# Patient Record
Sex: Male | Born: 1985 | Race: Black or African American | Hispanic: No
Health system: Western US, Academic
[De-identification: ages and names within clinical notes are randomized; demographics above are authoritative.]

## PROBLEM LIST (undated history)

## (undated) DIAGNOSIS — K219 Gastro-esophageal reflux disease without esophagitis: Secondary | ICD-10-CM

## (undated) DIAGNOSIS — Z21 Asymptomatic human immunodeficiency virus [HIV] infection status: Secondary | ICD-10-CM

## (undated) DIAGNOSIS — B20 Human immunodeficiency virus [HIV] disease: Secondary | ICD-10-CM

---

## 2013-04-22 ENCOUNTER — Ambulatory Visit (HOSPITAL_BASED_OUTPATIENT_CLINIC_OR_DEPARTMENT_OTHER): Payer: Medicaid Other

## 2013-04-22 ENCOUNTER — Other Ambulatory Visit (HOSPITAL_BASED_OUTPATIENT_CLINIC_OR_DEPARTMENT_OTHER): Payer: Self-pay | Admitting: Infectious Disease

## 2013-04-22 ENCOUNTER — Ambulatory Visit (HOSPITAL_BASED_OUTPATIENT_CLINIC_OR_DEPARTMENT_OTHER): Payer: Medicaid Other | Attending: Infectious Disease | Admitting: Infectious Disease

## 2013-04-22 ENCOUNTER — Encounter (HOSPITAL_BASED_OUTPATIENT_CLINIC_OR_DEPARTMENT_OTHER): Payer: Self-pay | Admitting: Infectious Disease

## 2013-04-22 VITALS — BP 112/68 | HR 71 | Temp 97.5°F | Resp 16 | Ht 73.31 in | Wt 153.2 lb

## 2013-04-22 DIAGNOSIS — B20 Human immunodeficiency virus [HIV] disease: Secondary | ICD-10-CM

## 2013-04-22 DIAGNOSIS — Z21 Asymptomatic human immunodeficiency virus [HIV] infection status: Secondary | ICD-10-CM | POA: Insufficient documentation

## 2013-04-22 DIAGNOSIS — K219 Gastro-esophageal reflux disease without esophagitis: Secondary | ICD-10-CM | POA: Insufficient documentation

## 2013-04-22 DIAGNOSIS — Z23 Encounter for immunization: Secondary | ICD-10-CM

## 2013-04-22 DIAGNOSIS — A539 Syphilis, unspecified: Secondary | ICD-10-CM | POA: Insufficient documentation

## 2013-04-22 LAB — URINALYSIS COMPLETE, URN
Bacteria, URN: NONE SEEN
Bilirubin (Qual), URN: NEGATIVE
Epith Cells_Renal/Trans,URN: NEGATIVE /HPF
Epith Cells_Squamous, URN: NEGATIVE /LPF
Glucose Qual, URN: NEGATIVE mg/dL
Ketones, URN: NEGATIVE mg/dL
Nitrite, URN: NEGATIVE
Occult Blood, URN: NEGATIVE
Protein (Alb Semiquant), URN: NEGATIVE mg/dL
RBC, URN: NEGATIVE /HPF
Specific Gravity, URN: 1.015 g/mL (ref 1.002–1.027)
pH, URN: 8 (ref 5.0–8.0)

## 2013-04-22 LAB — COMPREHENSIVE METABOLIC PANEL
ALT (GPT): 14 U/L (ref 10–64)
AST (GOT): 21 U/L (ref 15–40)
Albumin: 4.2 g/dL (ref 3.5–5.2)
Alkaline Phosphatase (Total): 70 U/L (ref 35–109)
Anion Gap: 5 (ref 3–11)
Bilirubin (Total): 0.4 mg/dL (ref 0.2–1.3)
Calcium: 9.4 mg/dL (ref 8.9–10.2)
Carbon Dioxide, Total: 24 mEq/L (ref 22–32)
Chloride: 105 mEq/L (ref 98–108)
Creatinine: 0.71 mg/dL (ref 0.51–1.18)
GFR, Calc, African American: >60 mL/min (ref >59)
GFR, Calc, European American: >60 mL/min (ref >59)
Glucose: 99 mg/dL (ref 62–125)
Potassium: 4 mEq/L (ref 3.7–5.2)
Protein (Total): 8 g/dL (ref 6.0–8.2)
Sodium: 134 mEq/L — ABNORMAL LOW (ref 136–145)
Urea Nitrogen: 8 mg/dL (ref 8–21)

## 2013-04-22 LAB — CBC, DIFF
% Basophils: 0 %
% Eosinophils: 7 %
% Immature Granulocytes: 0 %
% Lymphocytes: 49 %
% Monocytes: 9 %
% Neutrophils: 35 %
Absolute Eosinophil Count: 0.25 10*3/uL (ref 0.00–0.50)
Absolute Lymphocyte Count: 1.65 10*3/uL (ref 1.00–4.80)
Basophils: 0.01 10*3/uL (ref 0.00–0.20)
Hematocrit: 38 % (ref 38–50)
Hemoglobin: 13.1 g/dL (ref 13.0–18.0)
Immature Granulocytes: 0 10*3/uL (ref 0.00–0.05)
MCH: 28.1 pg (ref 27.3–33.6)
MCHC: 34.4 g/dL (ref 32.2–36.5)
MCV: 82 fL (ref 81–98)
Monocytes: 0.31 10*3/uL (ref 0.00–0.80)
Neutrophils: 1.19 10*3/uL — ABNORMAL LOW (ref 1.80–7.00)
Platelet Count: 208 10*3/uL (ref 150–400)
RBC: 4.66 mil/uL (ref 4.40–5.60)
RDW-CV: 14 % (ref 11.6–14.4)
WBC: 3.41 10*3/uL — ABNORMAL LOW (ref 4.30–10.00)

## 2013-04-22 MED ORDER — ABACAVIR-DOLUTEGRAVIR-LAMIVUD 600-50-300 MG OR TABS
1.0000 | ORAL_TABLET | Freq: Every day | ORAL | Status: DC
Start: 2013-04-22 — End: 2013-08-19

## 2013-04-22 NOTE — Progress Notes (Addendum)
[facility] - Outpatient Initial Clinic Note    DATE: 04/22/2013     CHIEF COMPLAINT:  Joshua Hammond is a 28 year old male who is here today for HIV     Assessment / Plan    # HIV, stage I:  I'm glad to have Joshua Hammond here in [place]. Records just became available for him, showing a most recent CD4 of 430, VL undetectable on Dolutegravir-Lamivudine-Abacavir. 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 I'm not anticipating a need for major changes. His adherence could be better. We discussed this, as well as a few strategies to do so.     # h/o Syphilis:   No symptoms at present. We will screen for now anyways.    # GERD:  Symptoms are quiescent for now. I wonder if this had to do with prior alcohol (it was certainly a trigger). Samuella Bruin is another consideration. If the symptoms recur, we can go hunting.     # HCM:   Hepatitis serologies from OSH: Hep A / B / C nonreactive.    GC/CT (U/P/A)   Syphilis screen  Influneza vaccine    -------------------------------------------  Attending: Ancil Boozer, MD  I discussed the history, exam and medical decision making for this patient with Dr.  Tia Alert. I was present in the clinic during the provisions of these services with no other clinical duties. I concur with the management plan as discussed.  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          He was diagnosed with HIV in July 2014, after being admitted for GI distress (in his mind, a GERD flare) with nausea and vomiting severe enough to merit hospitalization. HIV was sent as part of a general workup, and returned as positive. It had been some time since his last HIV testing. Per outside records, his initial CD4 count was 249, VL ~ 8000. After HLA-B57:01 returned as negative, he was started on ABC-lamivudine + dolutegravir. This was switched later to the triumeq combination pill (of the same medications). He endorses missing about one dose a week. On this regimen, his viral load dropped to undetectable. His last CD4 was 430.     He feels well at present: No GI or GERD symptoms. He notes no side effects from his medications. He feel healthy at present.     ROS:  Other than the HPI, all other systems are negative.      PROBLEM LIST:  Patient Active Problem List    Diagnosis Date Noted   . HIV (human immunodeficiency virus infection), Stage II [V08] 04/22/2013     HIV PCP: Maurice Small  Case Manager: TBD  HIV History:     Diagnosis date: Jul 2014 (VL 7880)    Risk factors for HIV: MSM    CD4 nadir: 249.  ARV history: Abacavir - Lamivudine-dolutegravir (Triumeq)    HIV related illnesses: None     . GERD (gastroesophageal reflux disease) [530.81] 04/22/2013   . h/o Syphilis [097.9] 04/22/2013       MEDICATIONS:  Current Outpatient Prescriptions   Medication Sig Dispense Refill   . Abacavir-Dolutegravir-Lamivud 600-50-300 MG Oral Tab Take 1 tablet by mouth daily.  30 tablet  3     No current facility-administered medications for this visit.       ALLERGIES:  Review of patient's allergies indicates:  No Known Allergies    SOCIAL / FAMILY HISTORY  Living situation: Staying with a friend. Recent migrant from [place]. Has lived previously in [place] and [place].   Sex: MSM. Serosorts + condoms.   Tob: None  EtOH: Occasional / "seldom". Does endorse heavier use triggering GERD in the past  IDU:  Never    Family with diabetes mellitus.     PHYSICAL EXAM:  BP 112/68  Pulse 71  Temp(Src) 97.5 F (36.4 C) (Temporal)  Resp 16  Ht 6' 1.31" (1.862 m)  Wt 153 lb 3.2 oz (69.491 kg)  BMI 20.04 kg/m2 Wt 153 lb 3.2 oz (69.491 kg) Ht 6' 1.31" (1.862 m) Body mass index is 20.04 kg/(m^2).  PHYSICAL EXAM:  General: Pleasant and friendly, robust appearing young [place] American man, healthy, alert, no distress  Skin: Skin color, texture, turgor normal. No rashes or concerning lesions  Head: Normocephalic.  Eyes: Lids/periorbital skin normal, Conjunctivae/corneas clear  Nose:normal  Oropharynx: Lips, mucosa, and tongue normal. Teeth and gums normal., posterior pharynx without erythema or drainage  Neck: supple.   Lungs: clear to auscultation  Heart: normal rate, regular rhythm and no murmurs, clicks, or gallops  Abd: soft, non-tender. BS normal. No masses or organomegaly. Liver size grossly normal to percussion.   Neuro:  Grossly normal to observation, gait normal. Hearing intact.     HIV MONITORING  TBD    LAB RESULTS:    Results for orders placed in visit on 04/22/13   COMPREHENSIVE METABOLIC PANEL       Result Value Range    Sodium 134 (*) 136 - 145 mEq/L    Potassium 4.0  3.7 - 5.2 mEq/L    Chloride 105  98 - 108 mEq/L    Carbon Dioxide, Total 24  22 - 32 mEq/L    Anion Gap 5  3 - 11    Glucose 99  62 - 125 mg/dL    Urea Nitrogen 8  8 - 21 mg/dL    Creatinine 0.71  0.51 - 1.18 mg/dL    Protein (Total) 8.0  6.0 - 8.2 g/dL    Albumin 4.2  3.5 - 5.2 g/dL    Bilirubin (Total) 0.4  0.2 - 1.3 mg/dL    Calcium 9.4  8.9 - 10.2 mg/dL    AST (GOT) 21  15 - 40 U/L    Alkaline Phosphatase (Total) 70  35 - 109 U/L    ALT (GPT) 14  10 - 64 U/L    Gfr, Calc, European American >60  >59 mL/min    GFR, Calc, African American >60  >59 mL/min    GFR, Information        Value: Calculated GFR in mL/min/1.73 m2 by MDRD equation.    Inaccurate with changing renal function.  See    [email or web address]

## 2013-04-22 NOTE — Progress Notes (Signed)
VACCINE SCREENING/ORDER WHEN CONTRAINDICATIONS PRESENT    Order date: 04/22/2013  [facility]  Clinic stock used: NO  Interpreter used?: None  Vaccine information sheet(s) discussed, patient/parent/guardian verbalized understanding? NO  Vaccines given: Flu  VIS given 04/22/2013 by Felecia Shellingristian J Bergeron MA.      SCREENING: INACTIVATED VACCINES  NO 1. Do you have a high fever, severe cold-like symptoms or serious infection?  NO 2. Do you have any food allergies such as eggs, chicken, chicken feathers, chicken dander, or gelatin?  NO 3. Do you have any allergies to neomycin, streptomycin, or polymyxin?   NO 4. Have you had any severe reaction to a vaccine in the past such as a seizure, a convulsion from a high fever, or a paralysis problem called Guillain-Barre Syndrome? If so, which vaccine(s):       If the patient/parent/or guardian answered "yes" to any of the above questions, consult with the provider to review the responses prior to administering vaccination. Parents with a contraindication to immunization will not receive the immunization without a specific physician order to vaccinate the patient and discussion of risk and benefits related to the contraindication.     Physician Order for Administration of Vaccine(s) When Contraindications Are Present  I have reviewed the existence in this patient's health history of possible contraindication(s) to administration of vaccine. The benefits to this patient outweigh the potential risks of immunization. Administer vaccine(s):  Felecia ShellingBergeron, Tristian J, 04/22/2013 3:05 PM MA.

## 2013-04-23 LAB — HEPATITIS A,B,& C PANEL
Hepatitis A Ab: REACTIVE
Hepatitis B Core Ab: NONREACTIVE
Hepatitis B Surf Antibody Intl Units: 369.46 IU
Hepatitis B Surface Ab: REACTIVE
Hepatitis B Surface Antigen w/Reflex: NONREACTIVE
Hepatitis C Antibody w/Rflx PCR: NONREACTIVE

## 2013-04-23 LAB — GC&CHLAM NUCLEIC ACID DETECTN
Chlam Trachomatis Nucleic Acid: NEGATIVE
Chlam Trachomatis Nucleic Acid: NEGATIVE
Chlam Trachomatis Nucleic Acid: NEGATIVE
N.Gonorrhoeae(GC) Nucleic Acid: NEGATIVE
N.Gonorrhoeae(GC) Nucleic Acid: NEGATIVE
N.Gonorrhoeae(GC) Nucleic Acid: NEGATIVE

## 2013-04-23 LAB — SEROLOGIC SYPHILIS PANEL, SRM: Syphilis Igg Ab Result: POSITIVE — AB

## 2013-04-23 LAB — T CELL SUBSETS CD4/CD8 ONLY COUNTS
% CD4: 40 % (ref 33–61)
% CD8: 31 % (ref 14–35)
Abs CD4 Lymphocyte Cnt: 0.563 10*3/uL — ABNORMAL LOW (ref 0.733–2.25)
Abs CD8 Lymphocyte Cnt: 0.429 10*3/uL (ref 0.25–1.24)
CD4/CD8 Ratio: 1.29

## 2013-04-23 LAB — HIV1 RNA QUANTITATION: HIV RNA Result: NOT DETECTED Copies/mL

## 2013-04-24 LAB — QUANTIFERON TB TEST
Quantiferon TB Ag Result: 0.097 IU gamma IF/mL
Quantiferon TB MIT Result: 5.459 IU gamma IF/mL
Quantiferon TB Nil Result: 0.124 IU gamma IF/mL

## 2013-04-24 LAB — ANTI TOXOPLASMA, IGG
Anti Toxoplasma, IgG Interp: NONREACTIVE
Anti Toxoplasma, IgG Result: <3.2 IU/mL (ref 0.0–7.4)

## 2013-04-24 LAB — HIGH RES TYPING, HLA B (SENDOUT): High Res Typ, HLA B Result (Sendout): NEGATIVE

## 2013-04-24 NOTE — Addendum Note (Signed)
Addended by: Orlena SheldonHARRINGTON, Shamon D on: 04/24/2013 01:11 PM     Modules accepted: Level of Service

## 2013-04-25 LAB — RPR QUANTITATIVE (SENDOUT): RPR Quantitative (Sendout): NEGATIVE

## 2013-04-29 LAB — SEROLOGIC SYPHILIS CONFIRM
CAPTIA T. pallidum EIA, IgG: REACTIVE — AB
RPR Screen: NEGATIVE
Syphilis Conf RPR Confirmation: NEGATIVE
TP_PA: REACTIVE — AB

## 2013-05-20 ENCOUNTER — Encounter (HOSPITAL_BASED_OUTPATIENT_CLINIC_OR_DEPARTMENT_OTHER): Payer: Self-pay | Admitting: Infectious Disease

## 2013-05-20 ENCOUNTER — Ambulatory Visit (HOSPITAL_BASED_OUTPATIENT_CLINIC_OR_DEPARTMENT_OTHER): Payer: No Typology Code available for payment source | Attending: Infectious Disease | Admitting: Infectious Disease

## 2013-05-20 VITALS — HR 85 | Temp 97.5°F | Resp 16 | Ht 73.0 in | Wt 160.0 lb

## 2013-05-20 DIAGNOSIS — Z23 Encounter for immunization: Secondary | ICD-10-CM | POA: Insufficient documentation

## 2013-05-20 NOTE — Progress Notes (Addendum)
[facility] - Outpatient Return Clinic Note    DATE: 05/20/2013     CHIEF COMPLAINT:  Joshua Hammond is a 28 year old male who is here today for HIV    Assessment / Plan  Today was my second visit with the delightful Joshua Hammond.    # HIV, stage I:  Doing reasonably well on his current regimen of ABC-Lamivudine-Dolutegravir. Missing about a dose a week. I'd still prefer to have slightly better adherence.    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 I'm not anticipating a need for major changes.    # h/o Syphilis:   Quant RPR negative one month ago. No new symptoms. Will consider screening again at the next visit.     # GERD:  Symptoms are quiescent for now.    # LEFT Leg pain:  Symptoms have resolved now. I wonder if this represented a VZV flare, quickly resolved. We agreed to be watchful, and have him evaluated if symptoms should recur.     # HCM:  - Hep A / B TBD  - PCV-13 [ x]  Mar / 2015    -------------------------------------------  Attending: Ancil Boozer, MD  I discussed the history, exam and medical decision making for this patient with Dr.  Tia Alert. I was present in the clinic during the provisions of these services with no other clinical duties. I concur with the management plan as discussed. 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 He's been taking his HIV meds without trouble, missing about one dose a week or less.    He does report one concern. About a week ago, he developed some burning pain on his left thigh, associated with some decreased sensation. He developed no rashes nor sores at the site. Symptoms  subsided in about four days. He had no associated symptomatic illness. He had chickenpox as a child.     ROS: Other than the HPI, all other systems are negative.    PROBLEM LIST:  Patient Active Problem List    Diagnosis Date Noted   . HIV (human immunodeficiency virus infection), Stage II [V08] 04/22/2013     HIV PCP: Maurice Small  Case Manager: TBD  HIV History:     Diagnosis date: Jul 2014 (VL 7880)    Risk factors for HIV: MSM    CD4 nadir: 249.     ARV history: Abacavir - Lamivudine-dolutegravir (Triumeq)    HIV related illnesses: None     . GERD (gastroesophageal reflux disease) [530.81] 04/22/2013   . h/o Syphilis [097.9] 04/22/2013       MEDICATIONS:  Current Outpatient Prescriptions   Medication Sig Dispense Refill   . Abacavir-Dolutegravir-Lamivud 600-50-300 MG Oral Tab Take 1 tablet by mouth daily.  30 tablet  3     No current facility-administered medications for this visit.       ALLERGIES:  Review of patient's allergies indicates:  No Known Allergies    SOCIAL / FAMILY HISTORY  Living situation: Staying with a friend. Recent migrant from [place]. Has lived previously in [place] and [place].   Sex: MSM. Serosorts + condoms.   Tob: None  EtOH: Occasional / "seldom". Heavier use triggering GERD  in the past  IDU: Never    Family with diabetes mellitus.     PHYSICAL EXAM:  Pulse 85  Temp(Src) 97.5 F (36.4 C) (Temporal)  Resp 16  Ht 6' 1"  (1.854 m)  Wt 160 lb (72.576 kg)  BMI 21.11 kg/m2  PHYSICAL EXAM:  General: Pleasant and friendly young African American man, healthy, alert, no distress  Skin: No rash, erythroderma, on bilateral thighs.   Head: Normocephalic  Eyes: Sclera anicteric  Lungs: Comfortable on room air without increased WOB  Neuro:  Grossly normal to observation, gait normal. Sensation to light touch intact and symmetric on bilateral thighs.     HIV LABS:  HIV RNA Result (Copies/mL)   Date Value   04/22/2013 None detected.       Abs CD4 Lymphocyte Cnt (THOU/uL)   Date Value   04/22/2013  0.563*         LAB RESULTS:      Results for orders placed in visit on 04/22/13   COMPREHENSIVE METABOLIC PANEL       Result Value Range    Sodium 134 (*) 136 - 145 mEq/L    Potassium 4.0  3.7 - 5.2 mEq/L    Chloride 105  98 - 108 mEq/L    Carbon Dioxide, Total 24  22 - 32 mEq/L    Anion Gap 5  3 - 11    Glucose 99  62 - 125 mg/dL    Urea Nitrogen 8  8 - 21 mg/dL    Creatinine 0.71  0.51 - 1.18 mg/dL    Protein (Total) 8.0  6.0 - 8.2 g/dL    Albumin 4.2  3.5 - 5.2 g/dL    Bilirubin (Total) 0.4  0.2 - 1.3 mg/dL    Calcium 9.4  8.9 - 10.2 mg/dL    AST (GOT) 21  15 - 40 U/L    Alkaline Phosphatase (Total) 70  35 - 109 U/L    ALT (GPT) 14  10 - 64 U/L    Gfr, Calc, European American >60  >59 mL/min    GFR, Calc, African American >60  >59 mL/min    GFR, Information        Value: Calculated GFR in mL/min/1.73 m2 by MDRD equation.    Inaccurate with changing renal function.  See   [email or web address]

## 2013-05-20 NOTE — Progress Notes (Signed)
VACCINE SCREENING/ORDER WHEN CONTRAINDICATIONS PRESENT    Order date: 05/20/2013  Ordering provider:Golob  Clinic stock used: YES   Interpreter used?: None  Vaccine information sheet(s) discussed, patient/parent/guardian verbalized understanding? YES   Vaccines given: Prevnar 13  VIS given 05/20/2013 by Felecia Shellingristian J Bergeron MA.      SCREENING: INACTIVATED VACCINES  NO 1. Do you have a high fever, severe cold-like symptoms or serious infection?  NO 2. Do you have any food allergies such as eggs, chicken, chicken feathers, chicken dander, or gelatin?  NO 3. Do you have any allergies to neomycin, streptomycin, or polymyxin?   NO 4. Have you had any severe reaction to a vaccine in the past such as a seizure, a convulsion from a high fever, or a paralysis problem called Guillain-Barre Syndrome? If so, which vaccine(s):     If the patient/parent/or guardian answered "yes" to any of the above questions, consult with the provider to review the responses prior to administering vaccination. Parents with a contraindication to immunization will not receive the immunization without a specific physician order to vaccinate the patient and discussion of risk and benefits related to the contraindication.     Physician Order for Administration of Vaccine(s) When Contraindications Are Present  I have reviewed the existence in this patient's health history of possible contraindication(s) to administration of vaccine. The benefits to this patient outweigh the potential risks of immunization. Administer vaccine(s):  Felecia ShellingBergeron, Tristian J, 05/20/2013 2:24 PM MA.

## 2013-05-29 ENCOUNTER — Other Ambulatory Visit (EMERGENCY_DEPARTMENT_HOSPITAL): Payer: Self-pay | Admitting: Emergency Medical Services

## 2013-05-29 ENCOUNTER — Ambulatory Visit: Payer: Self-pay

## 2013-05-29 ENCOUNTER — Emergency Department (HOSPITAL_BASED_OUTPATIENT_CLINIC_OR_DEPARTMENT_OTHER)
Admission: EM | Admit: 2013-05-29 | Discharge: 2013-05-29 | Disposition: A | Discharge status: Home / Self Care | Payer: No Typology Code available for payment source | Attending: Anesthesiology | Admitting: Anesthesiology

## 2013-05-29 DIAGNOSIS — I499 Cardiac arrhythmia, unspecified: Secondary | ICD-10-CM | POA: Insufficient documentation

## 2013-05-29 DIAGNOSIS — E860 Dehydration: Secondary | ICD-10-CM | POA: Insufficient documentation

## 2013-05-29 DIAGNOSIS — R110 Nausea: Secondary | ICD-10-CM

## 2013-05-29 DIAGNOSIS — R112 Nausea with vomiting, unspecified: Secondary | ICD-10-CM

## 2013-05-29 DIAGNOSIS — R109 Unspecified abdominal pain: Secondary | ICD-10-CM | POA: Insufficient documentation

## 2013-05-29 LAB — COMPREHENSIVE METABOLIC PANEL
ALT (GPT): 16 U/L (ref 10–64)
AST (GOT): 18 U/L (ref 9–38)
Albumin: 5 g/dL (ref 3.5–5.2)
Alkaline Phosphatase (Total): 70 U/L (ref 35–109)
Anion Gap: 7 (ref 4–12)
Bilirubin (Total): 0.5 mg/dL (ref 0.2–1.3)
Calcium: 10.3 mg/dL — ABNORMAL HIGH (ref 8.9–10.2)
Carbon Dioxide, Total: 26 mEq/L (ref 22–32)
Chloride: 102 mEq/L (ref 98–108)
Creatinine: 0.88 mg/dL (ref 0.51–1.18)
GFR, Calc, African American: >60 mL/min (ref >59)
GFR, Calc, European American: >60 mL/min (ref >59)
Glucose: 114 mg/dL (ref 62–125)
Potassium: 3.5 mEq/L — ABNORMAL LOW (ref 3.6–5.2)
Protein (Total): 9.4 g/dL — ABNORMAL HIGH (ref 6.0–8.2)
Sodium: 135 mEq/L (ref 135–145)
Urea Nitrogen: 11 mg/dL (ref 8–21)

## 2013-05-29 LAB — CBC, DIFF
% Basophils: 0 %
% Eosinophils: 1 %
% Immature Granulocytes: 0 %
% Lymphocytes: 24 %
% Monocytes: 5 %
% Neutrophils: 70 %
Absolute Eosinophil Count: 0.1 10*3/uL (ref 0.00–0.50)
Absolute Lymphocyte Count: 1.7 10*3/uL (ref 1.00–4.80)
Basophils: 0.03 10*3/uL (ref 0.00–0.20)
Hematocrit: 43 % (ref 38–50)
Hemoglobin: 14.9 g/dL (ref 13.0–18.0)
Immature Granulocytes: 0.01 10*3/uL (ref 0.00–0.05)
MCH: 28.3 pg (ref 27.3–33.6)
MCHC: 35.1 g/dL (ref 32.2–36.5)
MCV: 81 fL (ref 81–98)
Monocytes: 0.33 10*3/uL (ref 0.00–0.80)
Neutrophils: 4.79 10*3/uL (ref 1.80–7.00)
Platelet Count: 190 10*3/uL (ref 150–400)
RBC: 5.27 mil/uL (ref 4.40–5.60)
RDW-CV: 14.2 % (ref 11.6–14.4)
WBC: 6.96 10*3/uL (ref 4.30–10.00)

## 2013-05-29 LAB — PROTHROMBIN & PTT
Partial Thromboplastin Time: 30 s (ref 22–35)
Prothrombin INR: 1 (ref 0.8–1.3)
Prothrombin Time Patient: 12.9 s (ref 10.7–15.6)

## 2013-05-29 LAB — ALCOHOL (ETHYL): Alcohol (Ethyl): NEGATIVE mg/dL

## 2013-05-29 LAB — 1ST EXTRA ORANGE TOP

## 2013-05-29 LAB — LIPASE: Lipase: 31 U/L (ref <70)

## 2013-05-29 NOTE — Telephone Encounter (Signed)
Protocol: ABDOMINAL PAIN - MALE-ADULT-OH  Affirmative: SEVERE abdominal pain (e.g., excruciating)  Disposition of Go to ED Now suggested.

## 2013-05-29 NOTE — Telephone Encounter (Signed)
S/w pt briefly who was so incapacitated by pain that he asked partner to complete call. C/o severe constant abd pain w/vomiting x multiple episodes onset last night. Immunocompromised.

## 2013-06-01 ENCOUNTER — Other Ambulatory Visit (EMERGENCY_DEPARTMENT_HOSPITAL): Payer: Self-pay | Admitting: Family

## 2013-06-01 ENCOUNTER — Emergency Department (HOSPITAL_BASED_OUTPATIENT_CLINIC_OR_DEPARTMENT_OTHER)
Admission: EM | Admit: 2013-06-01 | Discharge: 2013-06-01 | Disposition: A | Discharge status: Home / Self Care | Payer: No Typology Code available for payment source | Attending: Emergency Medicine | Admitting: Emergency Medicine

## 2013-06-01 DIAGNOSIS — R112 Nausea with vomiting, unspecified: Secondary | ICD-10-CM

## 2013-06-01 DIAGNOSIS — R109 Unspecified abdominal pain: Secondary | ICD-10-CM

## 2013-06-01 DIAGNOSIS — Z79899 Other long term (current) drug therapy: Secondary | ICD-10-CM

## 2013-06-01 DIAGNOSIS — B20 Human immunodeficiency virus [HIV] disease: Secondary | ICD-10-CM

## 2013-06-01 LAB — CBC, DIFF
% Basophils: 0 %
% Eosinophils: 0 %
% Immature Granulocytes: 0 %
% Lymphocytes: 21 %
% Monocytes: 6 %
% Neutrophils: 73 %
Absolute Eosinophil Count: 0.01 10*3/uL (ref 0.00–0.50)
Absolute Lymphocyte Count: 1.06 10*3/uL (ref 1.00–4.80)
Basophils: 0.01 10*3/uL (ref 0.00–0.20)
Hematocrit: 40 % (ref 38–50)
Hemoglobin: 14 g/dL (ref 13.0–18.0)
Immature Granulocytes: 0.01 10*3/uL (ref 0.00–0.05)
MCH: 28.4 pg (ref 27.3–33.6)
MCHC: 35.2 g/dL (ref 32.2–36.5)
MCV: 81 fL (ref 81–98)
Monocytes: 0.29 10*3/uL (ref 0.00–0.80)
Neutrophils: 3.58 10*3/uL (ref 1.80–7.00)
Platelet Count: 200 10*3/uL (ref 150–400)
RBC: 4.93 mil/uL (ref 4.40–5.60)
RDW-CV: 14 % (ref 11.6–14.4)
WBC: 4.96 10*3/uL (ref 4.30–10.00)

## 2013-06-01 LAB — L LACTATE, VENOUS WB (TO U/H LAB WITHIN 30 MIN)
L Lactate (Direct), Venous Whole Blood: 3.2 mmol/L — ABNORMAL HIGH (ref 0.6–2.5)
L Lactate (Direct), Venous Whole Blood: 0.9 mmol/L (ref 0.6–2.5)

## 2013-06-01 LAB — COMPREHENSIVE METABOLIC PANEL
ALT (GPT): 14 U/L (ref 10–64)
AST (GOT): 17 U/L (ref 9–38)
Albumin: 4.6 g/dL (ref 3.5–5.2)
Alkaline Phosphatase (Total): 66 U/L (ref 35–109)
Anion Gap: 8 (ref 4–12)
Bilirubin (Total): 0.5 mg/dL (ref 0.2–1.3)
Calcium: 9.9 mg/dL (ref 8.9–10.2)
Carbon Dioxide, Total: 25 mEq/L (ref 22–32)
Chloride: 102 mEq/L (ref 98–108)
Creatinine: 0.77 mg/dL (ref 0.51–1.18)
GFR, Calc, African American: >60 mL/min (ref >59)
GFR, Calc, European American: >60 mL/min (ref >59)
Glucose: 125 mg/dL (ref 62–125)
Potassium: 3.7 mEq/L (ref 3.6–5.2)
Protein (Total): 8.4 g/dL — ABNORMAL HIGH (ref 6.0–8.2)
Sodium: 135 mEq/L (ref 135–145)
Urea Nitrogen: 6 mg/dL — ABNORMAL LOW (ref 8–21)

## 2013-06-01 LAB — URINALYSIS COMPLETE, URN
Bacteria, URN: NONE SEEN
Bilirubin (Qual), URN: NEGATIVE
Epith Cells_Renal/Trans,URN: NEGATIVE /HPF
Epith Cells_Squamous, URN: NEGATIVE /LPF
Glucose Qual, URN: NEGATIVE mg/dL
Ketones, URN: NEGATIVE mg/dL
Leukocyte Esterase, URN: NEGATIVE
Nitrite, URN: NEGATIVE
Occult Blood, URN: NEGATIVE
RBC, URN: NEGATIVE /HPF
Specific Gravity, URN: 1.016 g/mL (ref 1.002–1.027)
WBC, URN: NEGATIVE /HPF
pH, URN: 8.5 — ABNORMAL HIGH (ref 5.0–8.0)

## 2013-06-01 LAB — LIPASE: Lipase: 7 U/L (ref <70)

## 2013-06-01 LAB — 1ST EXTRA BLUE TOP

## 2013-06-30 ENCOUNTER — Other Ambulatory Visit (EMERGENCY_DEPARTMENT_HOSPITAL): Payer: Self-pay | Admitting: Emergency Medical Services

## 2013-06-30 ENCOUNTER — Emergency Department (HOSPITAL_BASED_OUTPATIENT_CLINIC_OR_DEPARTMENT_OTHER)
Admission: EM | Admit: 2013-06-30 | Discharge: 2013-06-30 | Disposition: A | Discharge status: Home / Self Care | Payer: No Typology Code available for payment source | Attending: Emergency Medicine | Admitting: Emergency Medicine

## 2013-06-30 DIAGNOSIS — R1032 Left lower quadrant pain: Secondary | ICD-10-CM | POA: Insufficient documentation

## 2013-06-30 DIAGNOSIS — K219 Gastro-esophageal reflux disease without esophagitis: Secondary | ICD-10-CM | POA: Insufficient documentation

## 2013-06-30 DIAGNOSIS — Z21 Asymptomatic human immunodeficiency virus [HIV] infection status: Secondary | ICD-10-CM

## 2013-06-30 DIAGNOSIS — R1110 Vomiting, unspecified: Secondary | ICD-10-CM | POA: Insufficient documentation

## 2013-06-30 LAB — CBC (HEMOGRAM)
Hematocrit: 41 % (ref 38–50)
Hemoglobin: 14.6 g/dL (ref 13.0–18.0)
MCH: 28.9 pg (ref 27.3–33.6)
MCHC: 35.3 g/dL (ref 32.2–36.5)
MCV: 82 fL (ref 81–98)
Platelet Count: 196 10*3/uL (ref 150–400)
RBC: 5.06 mil/uL (ref 4.40–5.60)
RDW-CV: 14.2 % (ref 11.6–14.4)
WBC: 6.51 10*3/uL (ref 4.30–10.00)

## 2013-06-30 LAB — COMPREHENSIVE METABOLIC PANEL
ALT (GPT): 12 U/L (ref 10–64)
AST (GOT): 16 U/L (ref 9–38)
Albumin: 4.7 g/dL (ref 3.5–5.2)
Alkaline Phosphatase (Total): 60 U/L (ref 35–109)
Anion Gap: 4 (ref 4–12)
Bilirubin (Total): 0.4 mg/dL (ref 0.2–1.3)
Calcium: 9.8 mg/dL (ref 8.9–10.2)
Carbon Dioxide, Total: 29 mEq/L (ref 22–32)
Chloride: 104 mEq/L (ref 98–108)
Creatinine: 0.86 mg/dL (ref 0.51–1.18)
GFR, Calc, African American: >60 mL/min (ref >59)
GFR, Calc, European American: >60 mL/min (ref >59)
Glucose: 118 mg/dL (ref 62–125)
Potassium: 3.7 mEq/L (ref 3.6–5.2)
Protein (Total): 8.6 g/dL — ABNORMAL HIGH (ref 6.0–8.2)
Sodium: 137 mEq/L (ref 135–145)
Urea Nitrogen: 11 mg/dL (ref 8–21)

## 2013-06-30 LAB — 1ST EXTRA BLUE TOP

## 2013-06-30 LAB — 1ST EXTRA ORANGE TOP

## 2013-07-01 ENCOUNTER — Other Ambulatory Visit (EMERGENCY_DEPARTMENT_HOSPITAL): Payer: Self-pay | Admitting: Family

## 2013-07-01 ENCOUNTER — Emergency Department (HOSPITAL_BASED_OUTPATIENT_CLINIC_OR_DEPARTMENT_OTHER)
Admission: EM | Admit: 2013-07-01 | Discharge: 2013-07-01 | Disposition: A | Discharge status: Home / Self Care | Payer: No Typology Code available for payment source | Attending: Emergency Medicine | Admitting: Emergency Medicine

## 2013-07-01 DIAGNOSIS — R109 Unspecified abdominal pain: Secondary | ICD-10-CM

## 2013-07-01 DIAGNOSIS — R112 Nausea with vomiting, unspecified: Secondary | ICD-10-CM

## 2013-07-01 DIAGNOSIS — R1110 Vomiting, unspecified: Secondary | ICD-10-CM

## 2013-07-01 LAB — 1ST EXTRA ORANGE TOP

## 2013-07-01 LAB — COMPREHENSIVE METABOLIC PANEL
ALT (GPT): 12 U/L (ref 10–64)
AST (GOT): 17 U/L (ref 9–38)
Albumin: 4.6 g/dL (ref 3.5–5.2)
Alkaline Phosphatase (Total): 61 U/L (ref 35–109)
Anion Gap: 8 (ref 4–12)
Bilirubin (Total): 0.4 mg/dL (ref 0.2–1.3)
Calcium: 9.8 mg/dL (ref 8.9–10.2)
Carbon Dioxide, Total: 25 mEq/L (ref 22–32)
Chloride: 103 mEq/L (ref 98–108)
Creatinine: 0.72 mg/dL (ref 0.51–1.18)
GFR, Calc, African American: >60 mL/min (ref >59)
GFR, Calc, European American: >60 mL/min (ref >59)
Glucose: 111 mg/dL (ref 62–125)
Potassium: 3.5 mEq/L — ABNORMAL LOW (ref 3.6–5.2)
Protein (Total): 8.4 g/dL — ABNORMAL HIGH (ref 6.0–8.2)
Sodium: 136 mEq/L (ref 135–145)
Urea Nitrogen: 6 mg/dL — ABNORMAL LOW (ref 8–21)

## 2013-07-01 LAB — URINALYSIS COMPLETE, URN
Bilirubin (Qual), URN: NEGATIVE
Epith Cells_Renal/Trans,URN: NEGATIVE /HPF
Epith Cells_Squamous, URN: NEGATIVE /LPF
Glucose Qual, URN: NEGATIVE mg/dL
Leukocyte Esterase, URN: NEGATIVE
Nitrite, URN: NEGATIVE
Occult Blood, URN: NEGATIVE
RBC, URN: NEGATIVE /HPF
Specific Gravity, URN: 1.028 g/mL — ABNORMAL HIGH (ref 1.002–1.027)
WBC, URN: NEGATIVE /HPF
pH, URN: 7.5 (ref 5.0–8.0)

## 2013-07-01 LAB — CBC (HEMOGRAM)
Hematocrit: 39 % (ref 38–50)
Hemoglobin: 13.8 g/dL (ref 13.0–18.0)
MCH: 28.2 pg (ref 27.3–33.6)
MCHC: 35.4 g/dL (ref 32.2–36.5)
MCV: 80 fL — ABNORMAL LOW (ref 81–98)
Platelet Count: 202 10*3/uL (ref 150–400)
RBC: 4.89 mil/uL (ref 4.40–5.60)
RDW-CV: 13.9 % (ref 11.6–14.4)
WBC: 6.47 10*3/uL (ref 4.30–10.00)

## 2013-07-01 LAB — LIPASE: Lipase: 12 U/L (ref <70)

## 2013-07-01 LAB — 1ST EXTRA BLUE TOP

## 2013-07-30 ENCOUNTER — Emergency Department (HOSPITAL_BASED_OUTPATIENT_CLINIC_OR_DEPARTMENT_OTHER)
Admission: EM | Admit: 2013-07-30 | Discharge: 2013-07-30 | Disposition: A | Discharge status: Home / Self Care | Payer: No Typology Code available for payment source | Attending: Physician Assistant | Admitting: Physician Assistant

## 2013-07-30 DIAGNOSIS — K047 Periapical abscess without sinus: Secondary | ICD-10-CM

## 2013-08-15 ENCOUNTER — Telehealth (HOSPITAL_BASED_OUTPATIENT_CLINIC_OR_DEPARTMENT_OTHER): Payer: Self-pay | Admitting: Infectious Disease

## 2013-08-15 NOTE — Telephone Encounter (Signed)
CONFIRMED PHONE NUMBER:   Telephone Information:   Home Phone [phone]   Work Phone [phone]   Mobile [phone]   CALLERS FIRST AND LAST NAME: Joshua Hammond  FACILITY NAME: n/a TITLE: n/a  CALLERS RELATIONSHIP:Self  RETURN CALL: General message on voicemail only     SUBJECT: Prescription Management   REASON FOR REQUEST: Refill request    MEDICATION: Abacavir  CONCERN/QUESTION: Refill request  PRESCRIBING PROVIDER: Dr. Darci NeedleJonathan Golob  DOSE TAKING NOW: n/a  PHARMACY NAME, LOCATION, & PHONE #: n/a     Patient called to request a refill on his medication.  He states that the pharmacy was closed and would like to get his refill.  He can be reached at [phone], it is okay to leave a detailed message.  Thank you.

## 2013-08-19 ENCOUNTER — Encounter (HOSPITAL_BASED_OUTPATIENT_CLINIC_OR_DEPARTMENT_OTHER): Payer: Self-pay | Admitting: Infectious Disease

## 2013-08-19 ENCOUNTER — Telehealth (HOSPITAL_BASED_OUTPATIENT_CLINIC_OR_DEPARTMENT_OTHER): Payer: Self-pay | Admitting: Pharmacy

## 2013-08-19 ENCOUNTER — Ambulatory Visit (HOSPITAL_BASED_OUTPATIENT_CLINIC_OR_DEPARTMENT_OTHER): Payer: No Typology Code available for payment source | Attending: Infectious Disease | Admitting: Infectious Disease

## 2013-08-19 VITALS — BP 115/68 | HR 80 | Temp 97.5°F | Resp 16 | Ht 73.0 in | Wt 147.0 lb

## 2013-08-19 DIAGNOSIS — F419 Anxiety disorder, unspecified: Secondary | ICD-10-CM

## 2013-08-19 DIAGNOSIS — B20 Human immunodeficiency virus [HIV] disease: Secondary | ICD-10-CM

## 2013-08-19 DIAGNOSIS — Z21 Asymptomatic human immunodeficiency virus [HIV] infection status: Secondary | ICD-10-CM | POA: Insufficient documentation

## 2013-08-19 DIAGNOSIS — F411 Generalized anxiety disorder: Secondary | ICD-10-CM | POA: Insufficient documentation

## 2013-08-19 MED ORDER — MIRTAZAPINE 15 MG OR TABS
15.0000 mg | ORAL_TABLET | Freq: Every evening | ORAL | Status: AC
Start: 2013-08-19 — End: ?

## 2013-08-19 MED ORDER — ABACAVIR-DOLUTEGRAVIR-LAMIVUD 600-50-300 MG OR TABS
1.0000 | ORAL_TABLET | Freq: Every day | ORAL | Status: DC
Start: 2013-08-19 — End: 2014-02-25

## 2013-08-19 NOTE — Progress Notes (Signed)
[facility] - Outpatient Return Clinic Note    DATE: 08/19/2013     CHIEF COMPLAINT:  Joshua Hammond is a 28 year old male who is here today for HIV    Assessment / Plan  # HIV, stage I:  Doing reasonably well on his current regimen of ABC-Lamivudine-Dolutegravir. Better adherence.     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 I'm not anticipating a need for major changes.    # Anxiety / Depression:  We discussed that most of his recent symptoms (lack of interest in activities, poor sleep, poor appetite, tearfulness, even some of the recent abdominal pain) all sound c/w significant depression symptoms. We discussed some choices, and settled on a trial of mirtazapine 70m PO qHS (OK to increase to 344mPO qHS if needed). I hope it will immediately help with his sleep and appetite, and in the coming weeks help further with mood.     We also discussed some non-pharmacologic interventions, primarily strategies to broaden his social circle.       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 Aside from a functional component, I wonder if there is a contribution from his escalated MJ use. Some aspects are reminiscent of gastroparesis. I suggested he cut back on MJ use to see if his symptoms improve.     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 No new partners.     # HCM:  - Hep A / B TBD  - PCV-13 [ x] Mar / 2015    ________________________________________________________________________  INTERVAL HISTORY/HPI:  [name] struggled a bit in the last few months, primarily with a down mood, low energy, poor sleep, poor appetite, and some unintended weight loss. He's been less interested in activities he previously enjoyed, and really doesn't feel himself. Anxiety, and social isolation have both contributed to him feeling off. The symptoms have become worse over the past few months. To help with both anxiety and sleep, he has  escalating his MJ use (to about twice daily); this initially helped, but recently less-so.     He's also struggled with some episodic abdominal pain, describing as feeling like a sudden onset of pressure or fullness that last for a few minutes. There is no association with activity, time of day, meals, types of food. He does note it tends to come on when he is feeling anxious, and relieves when he focuses on calming exercises.     ROS: Other than the HPI, all other systems are negative.    PROBLEM LIST:  Patient Active Problem List    Diagnosis Date Noted   . HIV (human immunodeficiency virus infection), Stage II [V08] 04/22/2013     HIV PCP: JoMaurice SmallCase Manager: TBD  HIV History:     Diagnosis date: Jul 2014 (VL 7880)    Risk factors for HIV: MSM    CD4 nadir: 249.     ARV history: Abacavir - Lamivudine-dolutegravir (Triumeq)    HIV related illnesses: None     . GERD (gastroesophageal reflux disease) [530.81] 04/22/2013   . h/o Syphilis [097.9] 04/22/2013       MEDICATIONS:  Current Outpatient Prescriptions   Medication Sig Dispense Refill   . Abacavir-Dolutegravir-Lamivud 600-50-300 MG Oral Tab Take 1 tablet by mouth daily. 30 tablet 3   . Mirtazapine 15 MG Oral Tab Take 1 tablet (15 mg) by mouth at bedtime. 60 tablet  2     No current facility-administered medications for this visit.       ALLERGIES:  Review of patient's allergies indicates:  No Known Allergies    SOCIAL / FAMILY HISTORY  Living situation: Staying with a friend. Recent migrant from [place]. Has lived previously in [place] and [place].   Sex: MSM. Serosorts + condoms.   Tob: None  EtOH: Occasional / "seldom". Heavier use triggering GERD in the past  IDU: Never    Family with diabetes mellitus.     PHYSICAL EXAM:  BP 115/68  Pulse 80  Temp(Src) 97.5 F (36.4 C) (Temporal)  Resp 16  Ht _0  (1.854 m)  Wt 147 lb (66.679 kg)  BMI 19.40 kg/m2  SpO2 98%  PHYSICAL EXAM:  General: Pleasant, friendly, [name], healthy, alert, no  distress  Skin: Skin color, texture, turgor normal.   Head: Normocephalic.  Eyes: Lids/periorbital skin normal, Conjunctivae/corneas clear  Oropharynx: Lips, mucosa, and tongue normal.  Neck: supple.   Lungs: Comfortable on room air without increased WOB.  Abd: soft, non-tender. BS normal. No masses or organomegaly. Liver approximately normal in span to percussion and non-tender edge.   Neuro:  Grossly normal to observation, gait normal    HIV LABS:  HIV RNA RESULT (Copies/mL)   Date Value   04/22/2013 None detected.      ABS CD4 LYMPHOCYTE CNT (THOU/uL)   Date Value   04/22/2013 0.563*         LAB RESULTS:    Results for orders placed in visit on 07/01/13   COMPREHENSIVE METABOLIC PANEL       Result Value Ref Range    Sodium 136  135 - 145 mEq/L    Potassium 3.5 (*) 3.6 - 5.2 mEq/L    Chloride 103  98 - 108 mEq/L    Carbon Dioxide, Total 25  22 - 32 mEq/L    Anion Gap 8  4 - 12    Glucose 111  62 - 125 mg/dL    Urea Nitrogen 6 (*) 8 - 21 mg/dL    Creatinine 0.72  0.51 - 1.18 mg/dL    Protein (Total) 8.4 (*) 6.0 - 8.2 g/dL    Albumin 4.6  3.5 - 5.2 g/dL    Bilirubin (Total) 0.4  0.2 - 1.3 mg/dL    Calcium 9.8  8.9 - 10.2 mg/dL    AST (GOT) 17  9 - 38 U/L    Alkaline Phosphatase (Total) 61  35 - 109 U/L    ALT (GPT) 12  10 - 64 U/L    Gfr, Calc, European American >60  >59 mL/min    GFR, Calc, African American >60  >59 mL/min    GFR, Information        Value: Calculated GFR in mL/min/1.73 m2 by MDRD equation.    Inaccurate with changing renal function.  See   [email or web address]

## 2013-08-19 NOTE — Telephone Encounter (Signed)
Prior Authorization Approval    Prescription Insurance: [facility]    Patient has received insurance approval for coverage of the following medication:    Medication: TRIUMEQ     Approved dates: 08/19/13-08/19/14    Approval #:       [facility]     PHARMACY NOTIFIED?    [  ] Fax:     [email or web address]  ] Telephone: approval done by phone with Isaias Cowman     [  ] Other:     Additional Comments:

## 2013-08-20 ENCOUNTER — Other Ambulatory Visit (EMERGENCY_DEPARTMENT_HOSPITAL): Payer: Self-pay | Admitting: Orthopaedic Surgery

## 2013-08-20 ENCOUNTER — Emergency Department (HOSPITAL_BASED_OUTPATIENT_CLINIC_OR_DEPARTMENT_OTHER)
Admission: EM | Admit: 2013-08-20 | Discharge: 2013-08-21 | Disposition: A | Discharge status: Home / Self Care | Payer: No Typology Code available for payment source | Attending: Emergency Medicine | Admitting: Emergency Medicine

## 2013-08-20 DIAGNOSIS — R109 Unspecified abdominal pain: Secondary | ICD-10-CM

## 2013-08-20 DIAGNOSIS — G8929 Other chronic pain: Secondary | ICD-10-CM | POA: Insufficient documentation

## 2013-08-20 LAB — URINALYSIS COMPLETE, URN
Bacteria, URN: NONE SEEN
Bilirubin (Qual), URN: NEGATIVE
Epith Cells_Renal/Trans,URN: NEGATIVE /HPF
Epith Cells_Squamous, URN: NEGATIVE /LPF
Glucose Qual, URN: NEGATIVE mg/dL
Leukocyte Esterase, URN: NEGATIVE
Nitrite, URN: NEGATIVE
Occult Blood, URN: NEGATIVE
Protein (Alb Semiquant), URN: NEGATIVE mg/dL
RBC, URN: NEGATIVE /HPF
Specific Gravity, URN: 1.016 g/mL (ref 1.002–1.027)
WBC, URN: NEGATIVE /HPF
pH, URN: 7 (ref 5.0–8.0)

## 2013-08-20 LAB — HEPATIC FUNCTION PANEL
ALT (GPT): 20 U/L (ref 10–64)
AST (GOT): 19 U/L (ref 9–38)
Albumin: 4.6 g/dL (ref 3.5–5.2)
Alkaline Phosphatase (Total): 74 U/L (ref 35–109)
Bilirubin (Direct): 0.1 mg/dL (ref 0.0–0.3)
Bilirubin (Total): 0.5 mg/dL (ref 0.2–1.3)
Protein (Total): 8.4 g/dL — ABNORMAL HIGH (ref 6.0–8.2)

## 2013-08-20 LAB — BASIC METABOLIC PANEL
Anion Gap: 7 (ref 4–12)
Calcium: 9.7 mg/dL (ref 8.9–10.2)
Carbon Dioxide, Total: 25 mEq/L (ref 22–32)
Chloride: 105 mEq/L (ref 98–108)
Creatinine: 0.73 mg/dL (ref 0.51–1.18)
GFR, Calc, African American: >60 mL/min (ref >59)
GFR, Calc, European American: >60 mL/min (ref >59)
Glucose: 116 mg/dL (ref 62–125)
Potassium: 3.6 mEq/L (ref 3.6–5.2)
Sodium: 137 mEq/L (ref 135–145)
Urea Nitrogen: 6 mg/dL — ABNORMAL LOW (ref 8–21)

## 2013-08-20 LAB — 1ST EXTRA BLUE TOP

## 2013-08-20 LAB — 1ST EXTRA ORANGE TOP

## 2013-08-20 LAB — CBC (HEMOGRAM)
Hematocrit: 41 % (ref 38–50)
Hemoglobin: 13.9 g/dL (ref 13.0–18.0)
MCH: 28.1 pg (ref 27.3–33.6)
MCHC: 34.2 g/dL (ref 32.2–36.5)
MCV: 82 fL (ref 81–98)
Platelet Count: 185 10*3/uL (ref 150–400)
RBC: 4.94 mil/uL (ref 4.40–5.60)
RDW-CV: 13.9 % (ref 11.6–14.4)
WBC: 5.74 10*3/uL (ref 4.30–10.00)

## 2013-08-20 LAB — LIPASE: Lipase: 8 U/L (ref <70)

## 2013-08-20 NOTE — Progress Notes (Signed)
-------------------------------------------    Attending: Octavia Heir.  I discussed the history, exam and medical decision making for this patient with Dr.  Eustaquio Maize. I was present in the clinic during the provisions of these services with no other clinical duties. I concur with the management plan as discussed. 1) HIV - suppressed on triumeq - no change in this today, 2) Depression with poor appetite and some anxiety - will trial mirtazapine and suggested he reduce use of MJ (in cae its responsible for GI symptoms)  -------------------------------------------

## 2013-08-22 ENCOUNTER — Emergency Department (HOSPITAL_BASED_OUTPATIENT_CLINIC_OR_DEPARTMENT_OTHER)
Admission: EM | Admit: 2013-08-22 | Discharge: 2013-08-22 | Disposition: A | Discharge status: Home / Self Care | Payer: No Typology Code available for payment source | Attending: Physician Assistant | Admitting: Physician Assistant

## 2013-08-22 ENCOUNTER — Other Ambulatory Visit (EMERGENCY_DEPARTMENT_HOSPITAL): Payer: Self-pay | Admitting: Physician Assistant

## 2013-08-22 DIAGNOSIS — R112 Nausea with vomiting, unspecified: Secondary | ICD-10-CM | POA: Insufficient documentation

## 2013-08-22 DIAGNOSIS — R109 Unspecified abdominal pain: Secondary | ICD-10-CM | POA: Insufficient documentation

## 2013-08-22 DIAGNOSIS — Z21 Asymptomatic human immunodeficiency virus [HIV] infection status: Secondary | ICD-10-CM

## 2013-08-22 LAB — COMPREHENSIVE METABOLIC PANEL
ALT (GPT): 17 U/L (ref 10–64)
AST (GOT): 14 U/L (ref 9–38)
Albumin: 4.6 g/dL (ref 3.5–5.2)
Alkaline Phosphatase (Total): 66 U/L (ref 35–109)
Anion Gap: 5 (ref 4–12)
Bilirubin (Total): 0.5 mg/dL (ref 0.2–1.3)
Calcium: 10 mg/dL (ref 8.9–10.2)
Carbon Dioxide, Total: 29 mEq/L (ref 22–32)
Chloride: 101 mEq/L (ref 98–108)
Creatinine: 0.76 mg/dL (ref 0.51–1.18)
GFR, Calc, African American: >60 mL/min (ref >59)
GFR, Calc, European American: >60 mL/min (ref >59)
Glucose: 132 mg/dL — ABNORMAL HIGH (ref 62–125)
Potassium: 3.7 mEq/L (ref 3.6–5.2)
Protein (Total): 8.4 g/dL — ABNORMAL HIGH (ref 6.0–8.2)
Sodium: 135 mEq/L (ref 135–145)
Urea Nitrogen: 8 mg/dL (ref 8–21)

## 2013-08-22 LAB — CBC, DIFF
% Basophils: 0 %
% Eosinophils: 0 %
% Immature Granulocytes: 0 %
% Lymphocytes: 14 %
% Monocytes: 3 %
% Neutrophils: 83 %
Absolute Eosinophil Count: 0 10*3/uL (ref 0.00–0.50)
Absolute Lymphocyte Count: 0.79 10*3/uL — ABNORMAL LOW (ref 1.00–4.80)
Basophils: 0 10*3/uL (ref 0.00–0.20)
Hematocrit: 43 % (ref 38–50)
Hemoglobin: 15 g/dL (ref 13.0–18.0)
Immature Granulocytes: 0.01 10*3/uL (ref 0.00–0.05)
MCH: 28.4 pg (ref 27.3–33.6)
MCHC: 35.3 g/dL (ref 32.2–36.5)
MCV: 80 fL — ABNORMAL LOW (ref 81–98)
Monocytes: 0.17 10*3/uL (ref 0.00–0.80)
Neutrophils: 4.75 10*3/uL (ref 1.80–7.00)
Platelet Count: 228 10*3/uL (ref 150–400)
RBC: 5.29 mil/uL (ref 4.40–5.60)
RDW-CV: 13.8 % (ref 11.6–14.4)
WBC: 5.72 10*3/uL (ref 4.30–10.00)

## 2013-08-22 LAB — 1ST EXTRA ORANGE TOP

## 2013-08-22 LAB — PROTHROMBIN & PTT
Partial Thromboplastin Time: 31 s (ref 22–35)
Prothrombin INR: 1 (ref 0.8–1.3)
Prothrombin Time Patient: 13 s (ref 10.7–15.6)

## 2013-09-06 ENCOUNTER — Telehealth (HOSPITAL_BASED_OUTPATIENT_CLINIC_OR_DEPARTMENT_OTHER): Payer: Self-pay | Admitting: Infectious Disease

## 2013-09-06 ENCOUNTER — Ambulatory Visit: Payer: Self-pay

## 2013-09-06 NOTE — Telephone Encounter (Signed)
Spoke to TRUE---he has been vomiting when he eats solid food but is able to keep down fluids and pills.  He says he was diagnosed with a stomach infection a long time ago and he never finished taking the treatment for it.  He thinks that's what's causing his symptoms now.    No appts available on 6/22 so I spoke to Dr. Eustaquio MaizeGolob who is willing to [name] on 6/22.  I called [name] back and scheduled him to see Dr. Eustaquio MaizeGolob on 6/22 at 12:15.  In the meantime I asked him to include gatorade or sports drinks along with water to stay hydrated.  Try eating bland foods in small amts--avoid dairy and greasy or fried foods until he's not vomited for 24 hrs.  If his vomiting worsens or his symptoms worsen at all before Monday I recommended he go to the nearest ED for eval

## 2013-09-06 NOTE — Telephone Encounter (Signed)
CONFIRMED PHONE NUMBER: [phone]   CALLERS FIRST AND LAST NAME: Nadeen Landauobert Leshawn Chandran  FACILITY NAME: na TITLE: na  CALLERS RELATIONSHIP:Self  RETURN CALL: Detailed message on voicemail only     SUBJECT: General Message   REASON FOR REQUEST: Patient states he has been having vomiting/throwing up to the point where he is not able to take his nightly medications.  Patient states he has severe stomach pain/sudden onset and has been having night sweats for about a week and a half as of 6/19.  Patient states he is dizzy, and has numbness/tingling in his knee when he stood up earlier on 6/19.  Patient spoke with consulting RN Judeth CornfieldStephanie from the community careline, who didn't think it was an emergency department or 911 situation, but that he did need to be evaluated in the next 24 hours and that the numbness/tingling may be from dehydration.  [facility].  Please advise.  Thank you.  The caller requested having a call to discuss from his care team at [facility], since [facility] attempted to connect and was unable to get a clinic staff member on the line.  Please contact patient as soon as possible in regards to this matter.  Thank you.  The caller stated that a detailed voicemail is okay if they are unable to pick up for any reason.  Please advise.  Thank you.  CCR sending message as high priority as patient states there is high medical necessity.  Please advise.  Thank you.      MESSAGE: See above.

## 2013-09-06 NOTE — Telephone Encounter (Signed)
Protocol: DIZZINESS - LIGHTHEADEDNESS-ADULT-AH  Affirmative: Taking a medicine that could cause dizziness (e.g., blood pressure medications, diuretics)  Disposition of See Physician Within 24 Hours suggested.

## 2013-09-06 NOTE — Telephone Encounter (Signed)
Urgent triage from contact center, Joshua Hammond, re dizziness    Past few weeks severe stomach pain, diarrhea, believes its from med  Weak, faint, dizzy  Probably dehydrated  EMS/911 not indicated.  Had contact center send message to clinic for further pt assistance, clinic currently open.    Call CCL back anytime if symptoms worsen or further questions.

## 2013-09-09 ENCOUNTER — Encounter (HOSPITAL_BASED_OUTPATIENT_CLINIC_OR_DEPARTMENT_OTHER): Payer: Self-pay | Admitting: Infectious Disease

## 2013-09-09 ENCOUNTER — Ambulatory Visit (HOSPITAL_BASED_OUTPATIENT_CLINIC_OR_DEPARTMENT_OTHER): Payer: No Typology Code available for payment source | Attending: Infectious Disease | Admitting: Infectious Disease

## 2013-09-09 VITALS — BP 104/60 | HR 71 | Temp 97.5°F | Resp 16 | Wt 142.0 lb

## 2013-09-09 DIAGNOSIS — R197 Diarrhea, unspecified: Secondary | ICD-10-CM | POA: Insufficient documentation

## 2013-09-09 DIAGNOSIS — R1110 Vomiting, unspecified: Secondary | ICD-10-CM | POA: Insufficient documentation

## 2013-09-09 MED ORDER — METOCLOPRAMIDE HCL 5 MG OR TABS
5.0000 mg | ORAL_TABLET | Freq: Four times a day (QID) | ORAL | Status: AC | PRN
Start: 2013-09-09 — End: ?

## 2013-09-09 MED ORDER — ONDANSETRON HCL 4 MG OR TABS
4.0000 mg | ORAL_TABLET | Freq: Two times a day (BID) | ORAL | Status: AC | PRN
Start: 2013-09-09 — End: ?

## 2013-09-09 MED ORDER — OMEPRAZOLE 20 MG OR CPDR
20.0000 mg | DELAYED_RELEASE_CAPSULE | Freq: Every day | ORAL | Status: AC
Start: 2013-09-09 — End: ?

## 2013-09-09 NOTE — Progress Notes (Signed)
[facility] - Outpatient Return Clinic Note    DATE: 09/09/2013     CHIEF COMPLAINT:  Joshua Hammond is a 28 year old male who is here today for HIV  and nausea / vomiting.    Assessment / Plan  # HIV, stage I:  Doing reasonably well on his current regimen of ABC-Lamivudine-Dolutegravir. Nausea is a challenge to his adherence.    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 I'm not anticipating a need for major changes.    # Nausea / Vomiting / Loose stools:  H. Pylori remains the most likely underlying etiology given his current symptoms, historic symptoms, and history of only partial treatment. We agreed to H.pylori stool antigen testing. We'll test for C.diff, largely for the ongoing low-level diarrhea.     After he's given a sample, we'll start omeprazole for symptomatic relief. Since prospective ondansetron was helpful to him (and is relatively benign), we'll start that as well as an option. For a PRN, I offered metaclopramide as a PRN to be used if the omeprazole and onsdansetron prove to be insufficient, after carefully discussing and describing tardive dyskinesia as a possible (rare) side effect.     Given his preserved CD4 count, and good adherence, I do not think a workup for rarer / AIDS associated infections would be fruitful.     We touched on MJ and cyclic vomiting syndrome. I do not think this is the most likely diagnosis at this time--particularly after a two week period of abstinence did not improve symptoms.     If he's H. Pylori positive, we'll start treatment. If all of his workup is negative, and symptoms are ongoing, we can revisit the MJ question, and ask for him to be evaluated by GI.    # Anxiety / Depression:  We'll put the mirtazapine on hold for now, while we work up his nausea / vomiting.     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 No new partners.     # HCM:  - Hep A / B TBD  - PCV-13 [ x] Mar / 2015      ________________________________________________________________________  INTERVAL HISTORY/HPI:  For the past three weeks (or so), Joshua Hammond has struggled with another stretch of recurrent nausea, vomiting, and diarrhea. He describes waking up in the morning (or more rarely at other times of day) with the sudden onset of severe nausea, followed by repeated emesis. When vomiting, he has central, cramping, abdominal pain, but otherwise is pain free. There is no blood in the emesis; he describes mostly foodstuffs, followed by watery / foamy / stomach-acid like material. Concurrently, he's been having chronic loose stools. A typical bout lasts about 24hrs.     Ondansetron taken AFTER the onset of symptoms has not been helpful; neither was promethazine provided by the ED. Ondansetron taken as a scheduled medication in the mornings helped somewhat (he felt) staving off an episode. He found IVF and IV antiemetics (via the ED) to be the most helpful in resolving an episode once started. Staying calm at home, and slowly introducing liquid foods perhaps worked as well. He has not trialed antacids. He abstained from [place] for about two weeks (from a baseline of daily use), and did not notice a change in the pattern of emesis.     He has no cough, no fevers, no chills. He does get the sweats after a long stretch of emesis. He notes no clear association between his medications  and the onset or worsening of symptoms    This is his second major episode of these symptoms. The first started before he acquired HIV. He recalls undergoing an EGD and colonoscopy, with biopsies eventually revealing (what sounds like) H. Pylori. He partially completed a multi-drug treatment with H. Pylori (stopping early when his symptoms resolved).       ROS: Other than the HPI, all other systems are negative.    PROBLEM LIST:  Patient Active Problem List    Diagnosis Date Noted   . HIV (human immunodeficiency virus infection), Stage II [V08] 04/22/2013      HIV PCP: Maurice Small  Case Manager: TBD  HIV History:     Diagnosis date: Jul 2014 (VL 7880)    Risk factors for HIV: MSM    CD4 nadir: 249.     ARV history: Abacavir - Lamivudine-dolutegravir (Triumeq)    HIV related illnesses: None     . GERD (gastroesophageal reflux disease) [530.81] 04/22/2013   . h/o Syphilis [097.9] 04/22/2013       MEDICATIONS:  Current Outpatient Prescriptions   Medication Sig Dispense Refill   . Abacavir-Dolutegravir-Lamivud 600-50-300 MG Oral Tab Take 1 tablet by mouth daily. 30 tablet 3   . Metoclopramide HCl 5 MG Oral Tab Take 1 tablet (5 mg) by mouth 4 times a day as needed for nausea/vomiting. 20 tablet 2   . Mirtazapine 15 MG Oral Tab Take 1 tablet (15 mg) by mouth at bedtime. 60 tablet 2   . Omeprazole 20 MG Oral CAPSULE DELAYED RELEASE Take 1 capsule (20 mg) by mouth daily on an empty stomach. Take at a different time of day than your HIV medications 30 capsule 1   . Ondansetron HCl 4 MG Oral Tab Take 1 tablet (4 mg) by mouth every 12 hours as needed for nausea/vomiting. 60 tablet 1     No current facility-administered medications for this visit.       ALLERGIES:  Review of patient's allergies indicates:  No Known Allergies    SOCIAL / FAMILY HISTORY  Living situation: Staying with a committed partner. Recent migrant from [place]. Has lived previously in [place] and [place].   Sex: MSM. Serosorts + condoms.   Tob: None  EtOH: Occasional / "seldom". Heavier use triggering GERD in the past. None recently.   IDU: Never  MJ: Daily    Family with diabetes mellitus.     PHYSICAL EXAM:  BP 104/60  Pulse 71  Temp(Src) 97.5 F (36.4 C) (Temporal)  Resp 16  Wt 142 lb (64.411 kg)  PHYSICAL EXAM:  General: Pleasant, friendly, thin and tall African American man, healthy, alert, no distress  Skin: Skin color, texture, turgor normal.  Head: Normocephalic.  Eyes: Sclera anicteric  Lungs: clear to auscultation  Heart: normal rate, regular rhythm and no murmurs, clicks, or gallops  Abd:  soft, non-tender. BS hypoactive. Liver edge non-tender. Liver approximately normal in size to percussion.   Neuro:  Grossly normal to observation, gait normal    HIV LABS:  HIV RNA RESULT (Copies/mL)   Date Value   04/22/2013 None detected.      ABS CD4 LYMPHOCYTE CNT (THOU/uL)   Date Value   04/22/2013 0.563*         LAB RESULTS:    Results for orders placed in visit on 08/20/13   CBC (HEMOGRAM)       Result Value Ref Range    WBC 5.74  4.30 - 10.00 THOU/uL  RBC 4.94  4.40 - 5.60 mil/uL    Hemoglobin 13.9  13.0 - 18.0 g/dL    Hematocrit 41  38 - 50 %    MCV 82  81 - 98 fL    MCH 28.1  27.3 - 33.6 pg    MCHC 34.2  32.2 - 36.5 g/dL    Platelet Count 185  150 - 400 THOU/uL    RDW-CV 13.9  11.6 - 14.4 %     Results for orders placed in visit on 08/22/13   COMPREHENSIVE METABOLIC PANEL       Result Value Ref Range    Sodium 135  135 - 145 mEq/L    Potassium 3.7  3.6 - 5.2 mEq/L    Chloride 101  98 - 108 mEq/L    Carbon Dioxide, Total 29  22 - 32 mEq/L    Anion Gap 5  4 - 12    Glucose 132 (*) 62 - 125 mg/dL    Urea Nitrogen 8  8 - 21 mg/dL    Creatinine 0.76  0.51 - 1.18 mg/dL    Protein (Total) 8.4 (*) 6.0 - 8.2 g/dL    Albumin 4.6  3.5 - 5.2 g/dL    Bilirubin (Total) 0.5  0.2 - 1.3 mg/dL    Calcium 10.0  8.9 - 10.2 mg/dL    AST (GOT) 14  9 - 38 U/L    Alkaline Phosphatase (Total) 66  35 - 109 U/L    ALT (GPT) 17  10 - 64 U/L    Gfr, Calc, European American >60  >59 mL/min    GFR, Calc, African American >60  >59 mL/min    GFR, Information        Value: Calculated GFR in mL/min/1.73 m2 by MDRD equation.  Inaccurate with changing renal function.  See [email or web address]     Results for orders placed in visit on 08/20/13   HEPATIC FUNCTION PANEL       Result Value Ref Range    Albumin 4.6  3.5 - 5.2 g/dL    Protein (Total) 8.4 (*) 6.0 - 8.2 g/dL    Bilirubin (Total) 0.5  0.2 - 1.3 mg/dL    Bilirubin (Direct) 0.1  0.0 - 0.3 mg/dL    Alkaline Phosphatase (Total) 74  35 - 109 U/L    AST  (GOT) 19  9 - 38 U/L    ALT (GPT) 20  10 - 64 U/L

## 2013-09-10 NOTE — Progress Notes (Signed)
-------------------------------------------    Attending: Octavia Heirobert D Cobe Viney Jr.  I discussed the history, exam and medical decision making for this patient with Dr.  Eustaquio MaizeGolob. I was present in the clinic during the provisions of these services with no other clinical duties. I concur with the management plan as discussed. 1) HIV - on triumeq and suppressed with CD4 > 600, 2) Persistent N&V - no effect of stopping all MJ, will Rx with PPI and check for H Pylori and will treat if found - if no response to PPI or H Pylori Rx will need EGD (abd CT times 2 have been negative along wlith eval for pancreatitis and hepatitis).  Also chekcing for C diff  -------------------------------------------

## 2013-09-11 ENCOUNTER — Other Ambulatory Visit (HOSPITAL_BASED_OUTPATIENT_CLINIC_OR_DEPARTMENT_OTHER): Payer: Self-pay | Admitting: Infectious Disease

## 2013-09-11 DIAGNOSIS — R197 Diarrhea, unspecified: Secondary | ICD-10-CM

## 2013-09-11 DIAGNOSIS — R1110 Vomiting, unspecified: Secondary | ICD-10-CM

## 2013-09-11 LAB — C. DIFFICILE TOXIN GENE BY PCR: C. difficile Toxin Gene by PCR: NEGATIVE

## 2013-09-14 LAB — H. PYLORI AG, STL: H. Pylori Ag, Stool (Sendout): NEGATIVE

## 2013-09-16 ENCOUNTER — Encounter (HOSPITAL_BASED_OUTPATIENT_CLINIC_OR_DEPARTMENT_OTHER): Payer: No Typology Code available for payment source | Admitting: Infectious Disease

## 2013-09-23 ENCOUNTER — Telehealth (HOSPITAL_BASED_OUTPATIENT_CLINIC_OR_DEPARTMENT_OTHER): Payer: Self-pay | Admitting: Infectious Disease

## 2013-09-23 NOTE — Telephone Encounter (Signed)
Attempted to call Mr. Loleta ChanceHill, and share the negative testing results for C.diff and H. Pylori. Call went directly to voicemail

## 2013-10-04 ENCOUNTER — Emergency Department (HOSPITAL_BASED_OUTPATIENT_CLINIC_OR_DEPARTMENT_OTHER): Admission: EM | Admit: 2013-10-04 | Discharge: 2013-10-05 | Payer: No Typology Code available for payment source

## 2013-10-14 ENCOUNTER — Encounter (HOSPITAL_BASED_OUTPATIENT_CLINIC_OR_DEPARTMENT_OTHER): Payer: No Typology Code available for payment source | Admitting: Infectious Disease

## 2014-02-25 ENCOUNTER — Other Ambulatory Visit (HOSPITAL_BASED_OUTPATIENT_CLINIC_OR_DEPARTMENT_OTHER): Payer: Self-pay | Admitting: Infectious Disease

## 2014-02-25 DIAGNOSIS — B20 Human immunodeficiency virus [HIV] disease: Secondary | ICD-10-CM

## 2014-02-26 NOTE — Telephone Encounter (Signed)
Triumeq was last filled on 12/22/13.    Patient last seen on 09/09/13 for HIV follow-up and was to return in 4 weeks.      Will defer refill authorization to provider given late refill and patient is due for follow-up and labs.    [facility]ed to [facility] to schedule follow up visit.

## 2014-02-27 NOTE — Telephone Encounter (Signed)
Could Not reach patient by phone (disconnected) sent unable to reach letter to patient

## 2014-03-06 MED ORDER — TRIUMEQ 600-50-300 MG OR TABS
ORAL_TABLET | ORAL | Status: AC
Start: 2014-03-06 — End: ?

## 2014-03-06 NOTE — Telephone Encounter (Signed)
Routing to on-call SW, as patient does not have established SW. Patient needs to be notified of refill status. [facility] sent unable to reach letter.

## 2014-03-06 NOTE — Telephone Encounter (Signed)
Patient MUST schedule follow-up before further refills.

## 2017-12-20 ENCOUNTER — Emergency Department (HOSPITAL_COMMUNITY): Payer: PRIVATE HEALTH INSURANCE

## 2017-12-20 ENCOUNTER — Emergency Department (HOSPITAL_COMMUNITY)
Admission: EM | Admit: 2017-12-20 | Discharge: 2017-12-20 | Disposition: A | Discharge status: Home / Self Care | Payer: PRIVATE HEALTH INSURANCE | Attending: Emergency Medicine | Admitting: Emergency Medicine

## 2017-12-20 ENCOUNTER — Encounter (HOSPITAL_COMMUNITY): Payer: Self-pay | Admitting: Family Medicine

## 2017-12-20 DIAGNOSIS — Z87891 Personal history of nicotine dependence: Secondary | ICD-10-CM | POA: Diagnosis not present

## 2017-12-20 DIAGNOSIS — R1011 Right upper quadrant pain: Secondary | ICD-10-CM | POA: Insufficient documentation

## 2017-12-20 DIAGNOSIS — F419 Anxiety disorder, unspecified: Secondary | ICD-10-CM | POA: Diagnosis not present

## 2017-12-20 DIAGNOSIS — Z79899 Other long term (current) drug therapy: Secondary | ICD-10-CM | POA: Diagnosis not present

## 2017-12-20 DIAGNOSIS — B20 Human immunodeficiency virus [HIV] disease: Secondary | ICD-10-CM | POA: Insufficient documentation

## 2017-12-20 DIAGNOSIS — R112 Nausea with vomiting, unspecified: Secondary | ICD-10-CM | POA: Diagnosis not present

## 2017-12-20 DIAGNOSIS — R1084 Generalized abdominal pain: Secondary | ICD-10-CM

## 2017-12-20 HISTORY — DX: Human immunodeficiency virus (HIV) disease: B20

## 2017-12-20 HISTORY — DX: Asymptomatic human immunodeficiency virus (hiv) infection status: Z21

## 2017-12-20 HISTORY — DX: Gastro-esophageal reflux disease without esophagitis: K21.9

## 2017-12-20 LAB — URINALYSIS, ROUTINE W REFLEX MICROSCOPIC
BILIRUBIN URINE: NEGATIVE
Bacteria, UA: NONE SEEN
GLUCOSE, UA: NEGATIVE mg/dL
HGB URINE DIPSTICK: NEGATIVE
Ketones, ur: 80 mg/dL — AB
LEUKOCYTES UA: NEGATIVE
NITRITE: NEGATIVE
PROTEIN: 100 mg/dL — AB
SPECIFIC GRAVITY, URINE: 1.038 — AB (ref 1.005–1.030)
pH: 5 (ref 5.0–8.0)

## 2017-12-20 LAB — COMPREHENSIVE METABOLIC PANEL
ALK PHOS: 68 U/L (ref 38–126)
ALT: 19 U/L (ref 0–44)
ANION GAP: 14 (ref 5–15)
AST: 21 U/L (ref 15–41)
Albumin: 4.8 g/dL (ref 3.5–5.0)
BILIRUBIN TOTAL: 1.3 mg/dL — AB (ref 0.3–1.2)
BUN: 16 mg/dL (ref 6–20)
CALCIUM: 10.1 mg/dL (ref 8.9–10.3)
CO2: 24 mmol/L (ref 22–32)
Chloride: 103 mmol/L (ref 98–111)
Creatinine, Ser: 0.91 mg/dL (ref 0.61–1.24)
GFR calc Af Amer: >60 mL/min (ref >60)
GFR calc non Af Amer: >60 mL/min (ref >60)
GLUCOSE: 119 mg/dL — AB (ref 70–99)
POTASSIUM: 3.6 mmol/L (ref 3.5–5.1)
Sodium: 141 mmol/L (ref 135–145)
Total Protein: 9.4 g/dL — ABNORMAL HIGH (ref 6.5–8.1)

## 2017-12-20 LAB — CBC
HEMATOCRIT: 45.8 % (ref 39.0–52.0)
HEMOGLOBIN: 16 g/dL (ref 13.0–17.0)
MCH: 28.6 pg (ref 26.0–34.0)
MCHC: 34.9 g/dL (ref 30.0–36.0)
MCV: 81.9 fL (ref 78.0–100.0)
Platelets: 214 10*3/uL (ref 150–400)
RBC: 5.59 MIL/uL (ref 4.22–5.81)
RDW: 13.4 % (ref 11.5–15.5)
WBC: 6 10*3/uL (ref 4.0–10.5)

## 2017-12-20 LAB — LIPASE, BLOOD: Lipase: 25 U/L (ref 11–51)

## 2017-12-20 MED ORDER — SODIUM CHLORIDE 0.9 % IV BOLUS
2000.0000 mL | Freq: Once | INTRAVENOUS | Status: AC
  Administered 2017-12-20: 2000 mL via INTRAVENOUS

## 2017-12-20 MED ORDER — SODIUM CHLORIDE 0.9 % IJ SOLN
INTRAMUSCULAR | Status: AC
  Filled 2017-12-20: qty 50

## 2017-12-20 MED ORDER — OMEPRAZOLE 20 MG PO CPDR: 20.0000 mg | DELAYED_RELEASE_CAPSULE | Freq: Every day | ORAL | 0 refills | Status: AC

## 2017-12-20 MED ORDER — ONDANSETRON 4 MG PO TBDP: 4.0000 mg | ORAL_TABLET | Freq: Three times a day (TID) | ORAL | 0 refills | Status: AC | PRN

## 2017-12-20 MED ORDER — IOHEXOL 300 MG/ML  SOLN
100.0000 mL | Freq: Once | INTRAMUSCULAR | Status: AC | PRN
  Administered 2017-12-20: 100 mL via INTRAVENOUS

## 2017-12-20 MED ORDER — ONDANSETRON HCL 4 MG/2ML IJ SOLN
4.0000 mg | Freq: Once | INTRAMUSCULAR | Status: AC
  Administered 2017-12-20: 4 mg via INTRAVENOUS
  Filled 2017-12-20: qty 2

## 2017-12-20 MED ORDER — SUCRALFATE 1 G PO TABS: 1.0000 g | ORAL_TABLET | Freq: Three times a day (TID) | ORAL | 0 refills | Status: AC

## 2017-12-20 MED ORDER — GI COCKTAIL ~~LOC~~
30.0000 mL | Freq: Once | ORAL | Status: AC
  Administered 2017-12-20: 30 mL via ORAL
  Filled 2017-12-20: qty 30

## 2017-12-20 MED ORDER — MORPHINE SULFATE (PF) 4 MG/ML IV SOLN
4.0000 mg | Freq: Once | INTRAVENOUS | Status: AC
  Administered 2017-12-20: 4 mg via INTRAVENOUS
  Filled 2017-12-20: qty 1

## 2017-12-20 NOTE — ED Notes (Signed)
Pt has been tolerating PO water, giving crackers now

## 2017-12-20 NOTE — ED Notes (Signed)
Pt states he is feeling a lot better

## 2017-12-20 NOTE — ED Notes (Signed)
Patient transported to X-ray 

## 2017-12-20 NOTE — ED Triage Notes (Signed)
Patient is complaining of generalized abd pain, nausea, vomiting, and diarrhea for the last 3 days. Patient reports this morning he started vomiting blood tinged, dark emesis. Denies fever.

## 2017-12-20 NOTE — ED Provider Notes (Signed)
[facility]-EMERGENCY DEPT Provider Note   CSN: [id number] Arrival date & time: 12/20/17  1205     History   Chief Complaint Chief Complaint  Patient presents with  . Emesis  . Abdominal Pain    HPI  Zachary Baldwin is a 32 y.o. male with a past medical history of HIV, reports compliance with his antiretrovirals abacavir-dolutegravir-lamivud 600-50-300 mg, who presents today for evaluation of abdominal pain, and emesis.  He reports that what really scared him today was that when he threw up as there was a small amount of dark, coffee-ground blood in his vomit.  He has never had anything like this before.  He is previously seen a GI doctor, however has never had an endoscopy.  He says that in July he had some abdominal pain, however it was different, than it was more of his epigastric area, however now this is primarily right-sided.  Chart review shows that he had a CT scan then without evidence of intra-abdominal pathology.  He has never had any abdominal surgeries before.  Denies any fevers.  He has not had any diarrhea with this emesis.  He does report that he last used cannabis 2 days ago, however his symptoms started 3 days ago.  He is currently visiting from [place], will be going back to [place] on the fifth.  He reports a large amount of anxiety, feeling like his mind is racing.  He says that on his CAT scan back in July he had a incidental finding of a lung nodule and he is concerned because his uncle passed away from lung cancer early this year.  HPI  Past Medical History:  Diagnosis Date  . GERD (gastroesophageal reflux disease)   . HIV (human immunodeficiency virus infection) (HCC)     There are no active problems to display for this patient.   History reviewed. No pertinent surgical history.      Home Medications    Prior to Admission medications   Medication Sig Start Date End Date Taking? Authorizing Provider  abacavir-dolutegravir-lamiVUDine  (TRIUMEQ) 600-50-300 MG tablet Take 1 tablet by mouth at bedtime.   Yes [provider]  omeprazole (PRILOSEC) 20 MG capsule Take 1 capsule (20 mg total) by mouth daily for 14 days. 12/20/17 01/03/18  Cristina Gong, PA-C  ondansetron (ZOFRAN ODT) 4 MG disintegrating tablet Take 1 tablet (4 mg total) by mouth every 8 (eight) hours as needed for nausea or vomiting. 12/20/17   Cristina Gong, PA-C  sucralfate (CARAFATE) 1 g tablet Take 1 tablet (1 g total) by mouth 4 (four) times daily -  with meals and at bedtime for 10 days. 12/20/17 12/30/17  Cristina Gong, PA-C    Family History History reviewed. No pertinent family history.  Social History Social History   Tobacco Use  . Smoking status: Former Games developer  . Smokeless tobacco: Never Used  Substance Use Topics  . Alcohol use: Not Currently  . Drug use: Yes    Types: Marijuana    Comment: Last used: 2 days ago      Allergies   Patient has no known allergies.   Review of Systems Review of Systems  Constitutional: Negative for chills and fever.  HENT: Negative for congestion.   Respiratory: Negative for chest tightness and shortness of breath.   Cardiovascular: Positive for chest pain (Burning for one day, since started throwing up. ).  Gastrointestinal: Positive for abdominal pain, nausea and vomiting. Negative for abdominal distention, anal bleeding,  diarrhea and rectal pain.       No dark, tarry, sticky bowel movements  Genitourinary: Positive for decreased urine volume. Negative for dysuria, hematuria and urgency.  Musculoskeletal: Negative for arthralgias, neck pain and neck stiffness.  Skin: Negative for color change and rash.  Neurological: Negative for weakness and headaches.  Psychiatric/Behavioral: The patient is nervous/anxious.   All other systems reviewed and are negative.    Physical Exam Updated Vital Signs BP 112/76 (BP Location: Left Arm)   Pulse 64   Temp 98.3 F (36.8 C) (Oral)    Resp 16   Ht 6' (1.829 m)   Wt 70.3 kg   SpO2 97%   BMI 21.02 kg/m   Physical Exam  Constitutional: He is oriented to person, place, and time. He appears well-developed and well-nourished.  Non-toxic appearance. No distress.  HENT:  Head: Normocephalic and atraumatic.  Eyes: Conjunctivae are normal.  Neck: Neck supple.  Cardiovascular: Normal rate, regular rhythm and normal heart sounds.  No murmur heard. Pulmonary/Chest: Effort normal and breath sounds normal. No respiratory distress.  Abdominal: Soft. Normal appearance and bowel sounds are normal. There is tenderness in the right upper quadrant. There is rebound. There is no rigidity, no guarding and no CVA tenderness.  Musculoskeletal: He exhibits no edema.  Neurological: He is alert and oriented to person, place, and time.  Skin: Skin is warm and dry.  Psychiatric: His speech is normal. His mood appears anxious.  Tearful  Nursing note and vitals reviewed.    ED Treatments / Results  Labs (all labs ordered are listed, but only abnormal results are displayed) Labs Reviewed  COMPREHENSIVE METABOLIC PANEL - Abnormal; Notable for the following components:      Result Value   Glucose, Bld 119 (*)    Total Protein 9.4 (*)    Total Bilirubin 1.3 (*)    All other components within normal limits  URINALYSIS, ROUTINE W REFLEX MICROSCOPIC - Abnormal; Notable for the following components:   Specific Gravity, Urine 1.038 (*)    Ketones, ur 80 (*)    Protein, ur 100 (*)    All other components within normal limits  LIPASE, BLOOD  CBC    EKG None  Radiology Dg Chest 2 View  Result Date: 12/20/2017 CLINICAL DATA:  Chest pain EXAM: CHEST - 2 VIEW COMPARISON:  None. FINDINGS: Lungs are clear. Heart size and pulmonary vascularity are normal. No adenopathy. No pneumothorax. No bone lesions. IMPRESSION: No edema or consolidation. Electronically Signed   By: Bretta Bang III M.D.   On: 12/20/2017 14:56   Ct Abdomen Pelvis W  Contrast  Result Date: 12/20/2017 CLINICAL DATA:  Nausea vomiting abdominal pain EXAM: CT ABDOMEN AND PELVIS WITH CONTRAST TECHNIQUE: Multidetector CT imaging of the abdomen and pelvis was performed using the standard protocol following bolus administration of intravenous contrast. CONTRAST:  OMNIPAQUE IOHEXOL 300 MG/ML  SOLN COMPARISON:  None. FINDINGS: Lower chest: Lung bases clear bilaterally. Hepatobiliary: No focal liver abnormality is seen. No gallstones, gallbladder wall thickening, or biliary dilatation. Pancreas: Negative Spleen: Negative Adrenals/Urinary Tract: Adrenal glands are unremarkable. Kidneys are normal, without renal calculi, focal lesion, or hydronephrosis. Bladder is unremarkable. Stomach/Bowel: Stomach is within normal limits. Appendix appears normal. No evidence of bowel wall thickening, distention, or inflammatory changes. Vascular/Lymphatic: Negative Reproductive: Negative Other: None Musculoskeletal: Negative IMPRESSION: Negative CT abdomen pelvis. No cause for the patient's symptoms is identified. Electronically Signed   By: Marlan Palau M.D.   On: 12/20/2017 16:16  Procedures Procedures (including critical care time)  Medications Ordered in ED Medications  ondansetron (ZOFRAN) injection 4 mg (4 mg Intravenous Given 12/20/17 1439)  morphine 4 MG/ML injection 4 mg (4 mg Intravenous Given 12/20/17 1439)  sodium chloride 0.9 % bolus 2,000 mL (0 mLs Intravenous Stopped 12/20/17 1542)  iohexol (OMNIPAQUE) 300 MG/ML solution 100 mL (100 mLs Intravenous Contrast Given 12/20/17 1559)  gi cocktail (Maalox,Lidocaine,Donnatal) (30 mLs Oral Given 12/20/17 1649)     Initial Impression / Assessment and Plan / ED Course  I have reviewed the triage vital signs and the nursing notes.  Pertinent labs & imaging results that were available during my care of the patient were reviewed by me and considered in my medical decision making (see chart for details).  Clinical Course as of  Dec 22 2318  Wed Dec 20, 2017  1842 Reevaluated, he has passed p.o. challenge and wishes for discharge at this time.  He reports that his stomach pain is fully gone away.   [EH]    Clinical Course User Index [EH] Cristina Gong, PA-C   Patient presents today for evaluation of emesis and abdominal pain.  This is been going on for 3 days and has not improved.  His abdomen was tender to palpation in the right upper quadrant with rebound tenderness.  His bilirubin is mildly elevated at 1.3, however do not have old in the system to compare to.  Discussed work-up and treatment evaluation options with patient, given that he has emesis without diarrhea with slight elevation in bilirubin he wishes for CT scan.  He was given 2 liters IVF, GI cocktail, Morphine and zofran, after which he reported resolution of his symptoms.  His CT scan did not show any cause for his symptoms.  He did report hematemesis, however his hemoglobin is normal, he has not had repeat episodes of this while in the department, suspect this is gastritis secondary to vomiting and may have a Mallory-Weiss tear.  Recommended following up with GI when he returns to [place] as he is here only here visiting.  Given symptomatic treatment of Prilosec, Carafate, and Zofran.  Importance of maintaining hydration was discussed with patient.  Return precautions were discussed with patient who states their understanding.  At the time of discharge patient denied any unaddressed complaints or concerns.  Patient is agreeable for discharge home.   Final Clinical Impressions(s) / ED Diagnoses   Final diagnoses:  Generalized abdominal pain  Non-intractable vomiting with nausea, unspecified vomiting type    ED Discharge Orders         Ordered    omeprazole (PRILOSEC) 20 MG capsule  Daily     12/20/17 1844    sucralfate (CARAFATE) 1 g tablet  3 times daily with meals & bedtime     12/20/17 1844    ondansetron (ZOFRAN ODT) 4 MG disintegrating  tablet  Every 8 hours PRN     12/20/17 1844           Norman Clay 12/21/17 2327    Azalia Bilis, MD 12/21/17 2355

## 2017-12-20 NOTE — Discharge Instructions (Addendum)
Please start eating bland foods.  Please follow up with your doctors when she returned home.  In the meantime if your symptoms worsen, or you have additional concerns please seek additional medical care and evaluation.

## 2019-09-02 IMAGING — CR DG CHEST 2V
3 series · 3 of 3 positions shown · non-contrast
Comparison: None.

CLINICAL DATA: Chest pain

EXAM:
CHEST - 2 VIEW

[w chest lat]
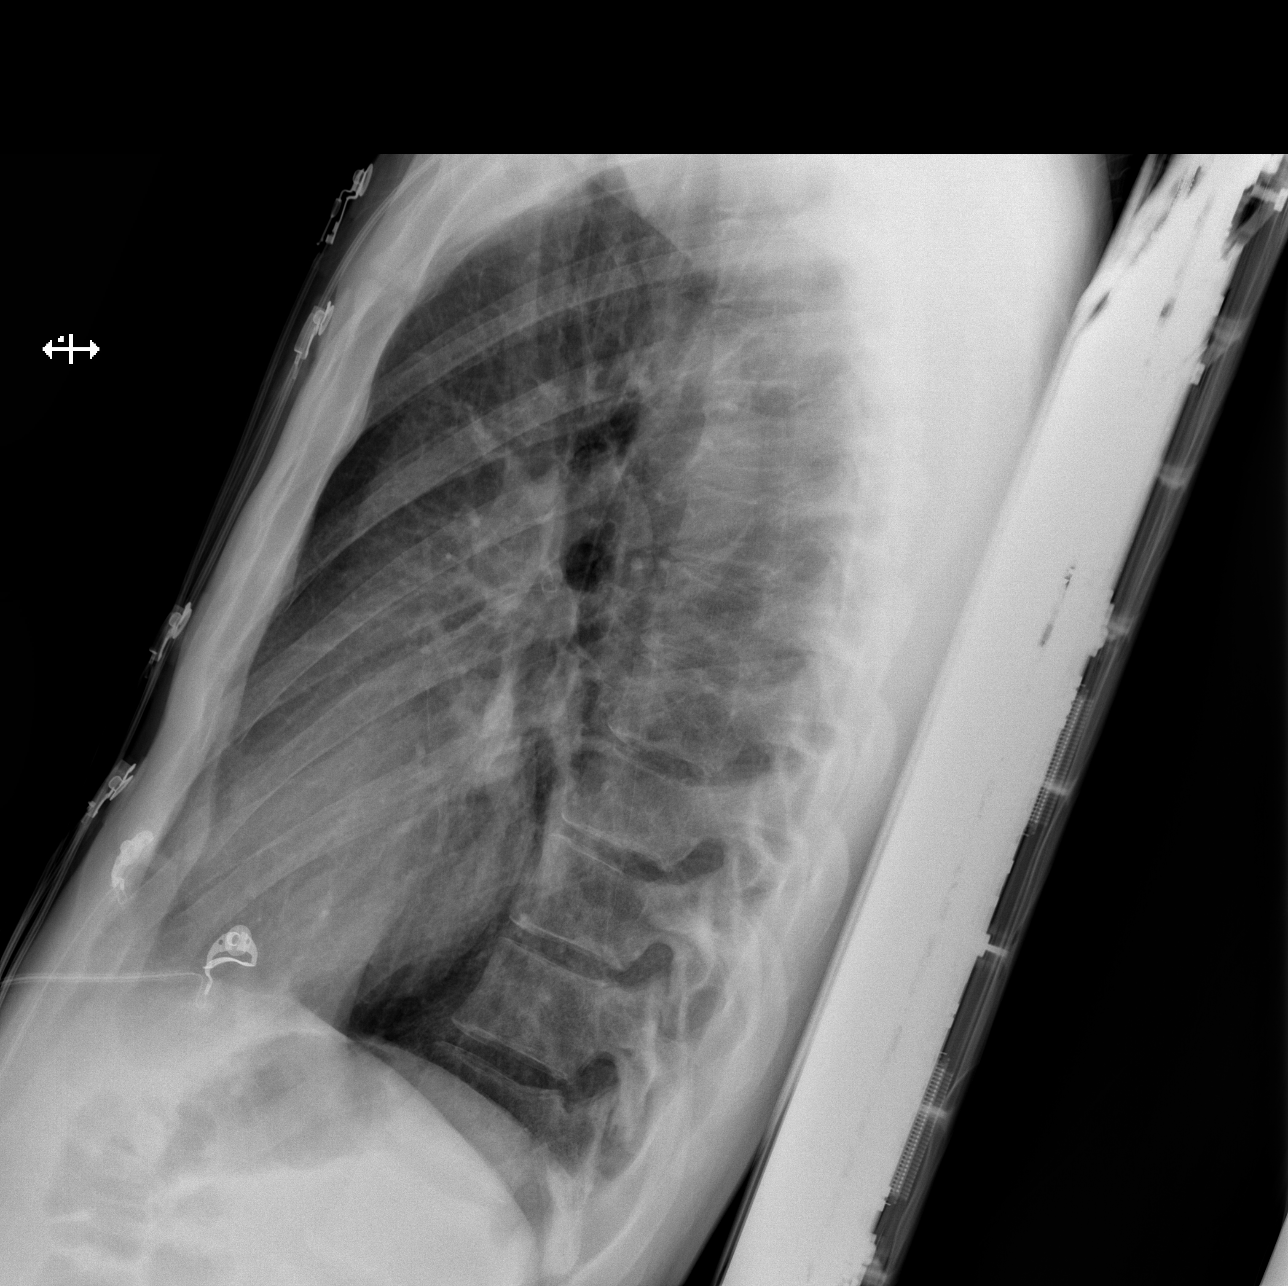

[x chest ap (1 of 2)]
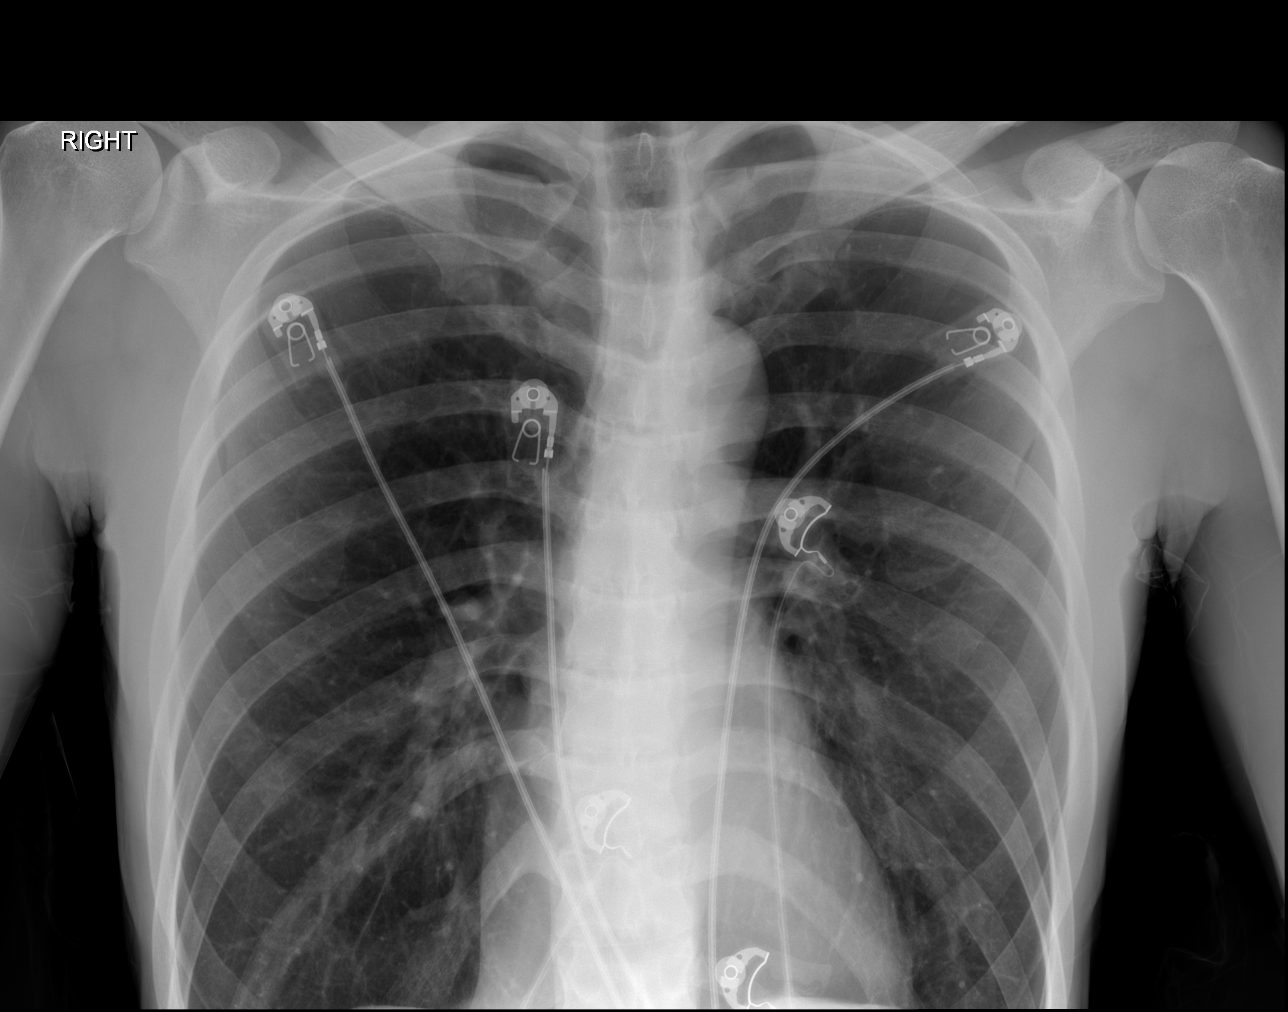

[x chest ap (2 of 2)]
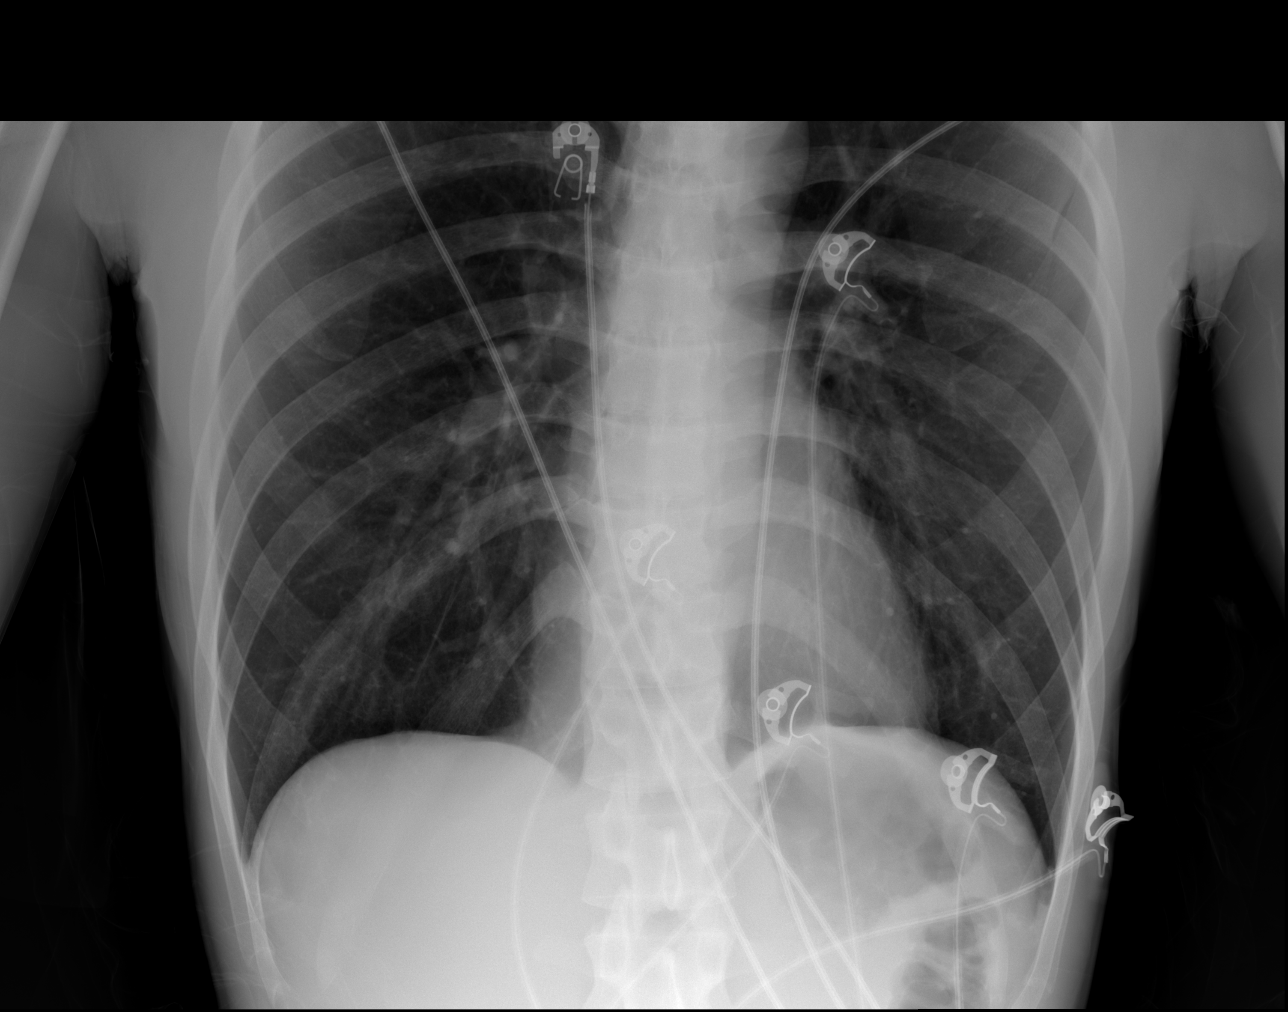

[3 of 3 positions shown; findings below may reference images not displayed]

FINDINGS: Lungs are clear. Heart size and pulmonary vascularity are normal. No
adenopathy. No pneumothorax. No bone lesions..
IMPRESSION: No edema or consolidation.

## 2019-09-02 IMAGING — CT CT ABD-PELV W/ CM
2 of 4 series · 16 of 46 positions shown, 18 images · IV contrast (APPLIED)
Comparison: None.

CLINICAL DATA: Nausea vomiting abdominal pain

EXAM:
CT ABDOMEN AND PELVIS WITH CONTRAST
TECHNIQUE: Multidetector CT imaging of the abdomen and pelvis was performed
using the standard protocol following bolus administration of
intravenous contrast.
CONTRAST:  100mL OMNIPAQUE IOHEXOL 300 MG/ML  SOLN

[Series 2: axial st · axial · 0.82mm/px · z∈[-310,+110]mm · 13 of 94 slices shown, 15 images]
[im 5/94  soft-tissue]
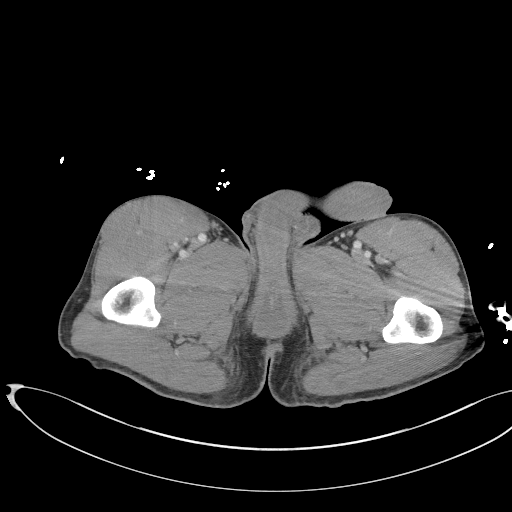
[im 5/94  bone]
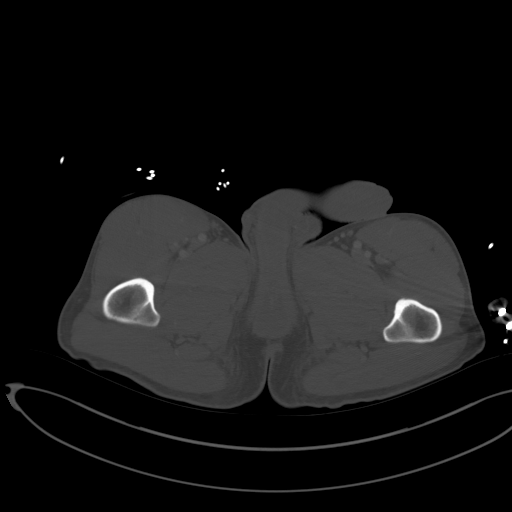
[im 15/94  soft-tissue]
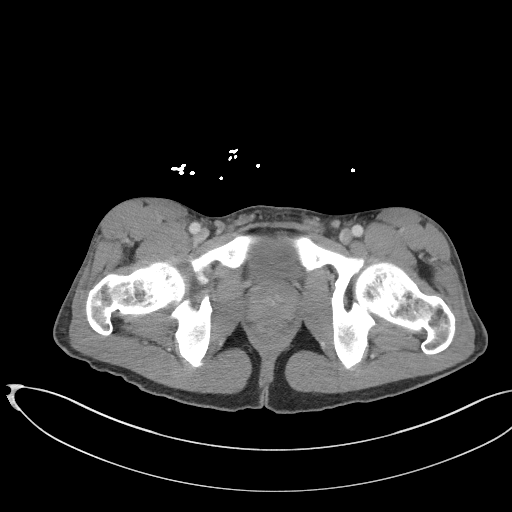
[im 20/94  soft-tissue]
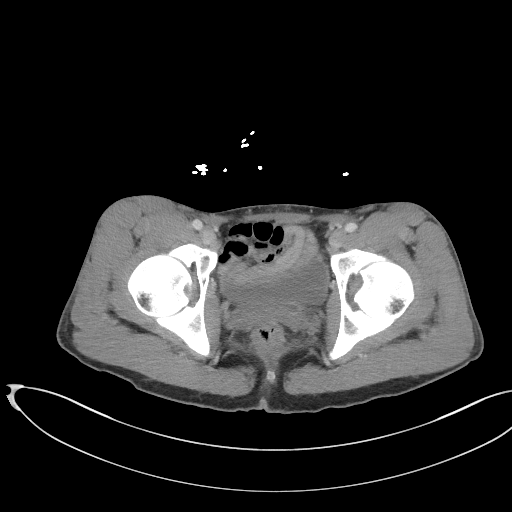
[im 25/94  soft-tissue]
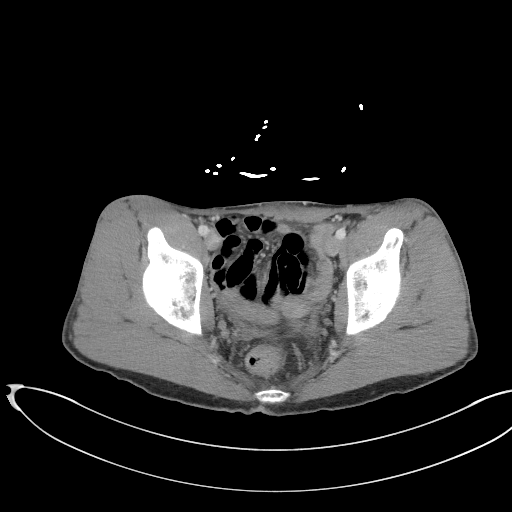
[im 35/94  soft-tissue]
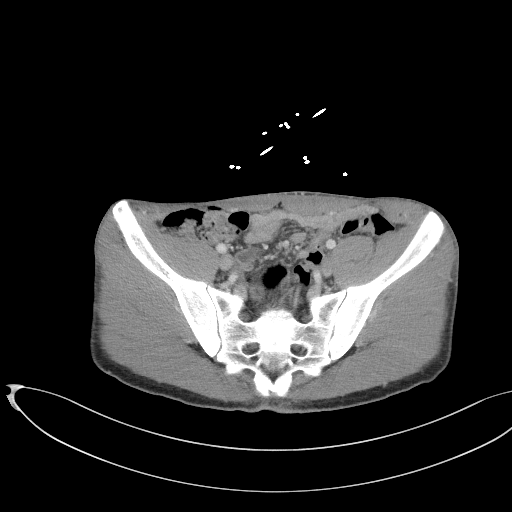
[im 40/94  soft-tissue]
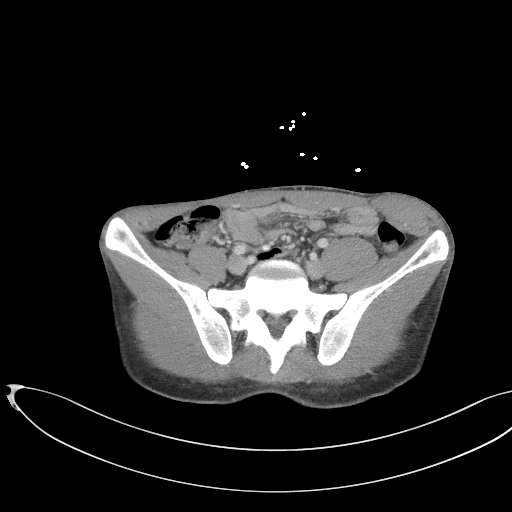
[im 49/94  soft-tissue]
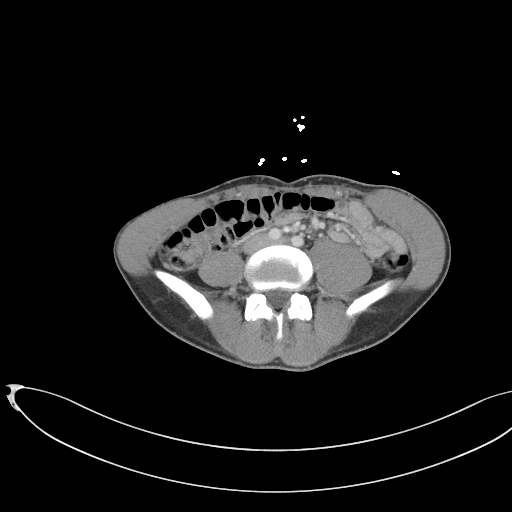
[im 54/94  soft-tissue]
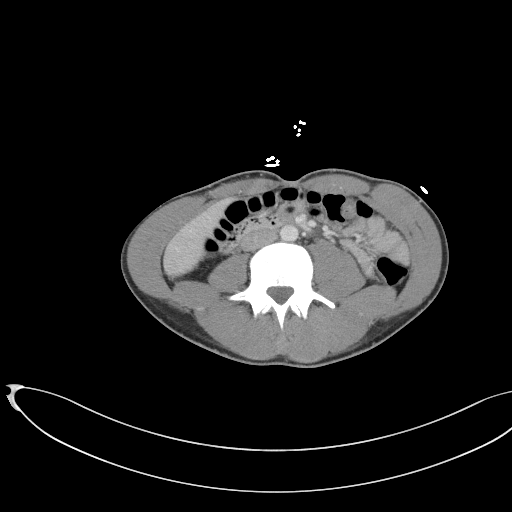
[im 59/94  soft-tissue]
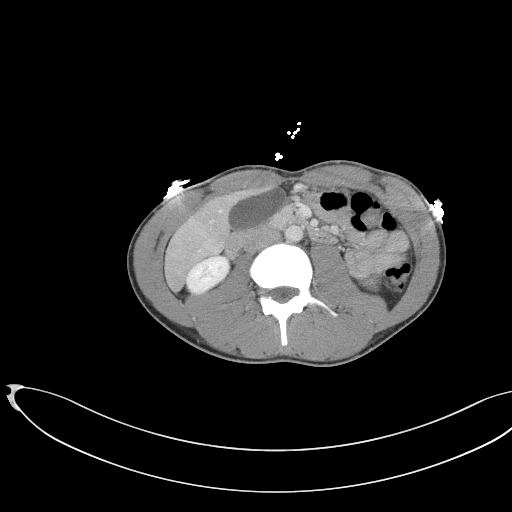
[im 59/94  bone]
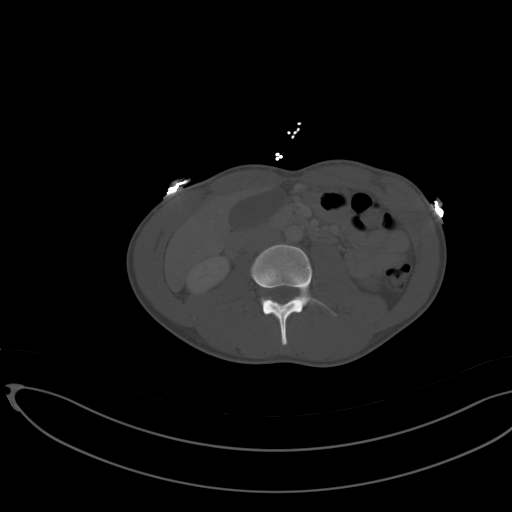
[im 69/94  soft-tissue]
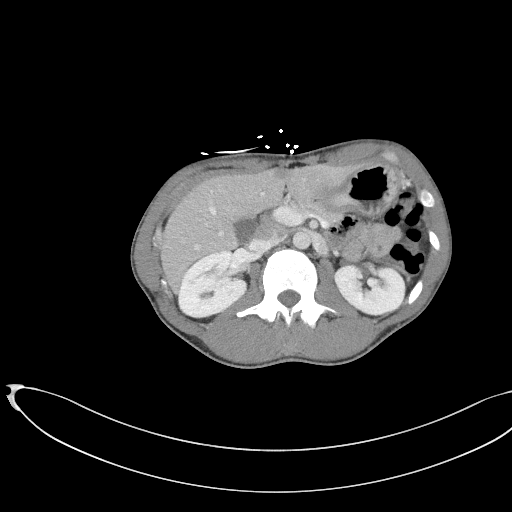
[im 74/94  soft-tissue]
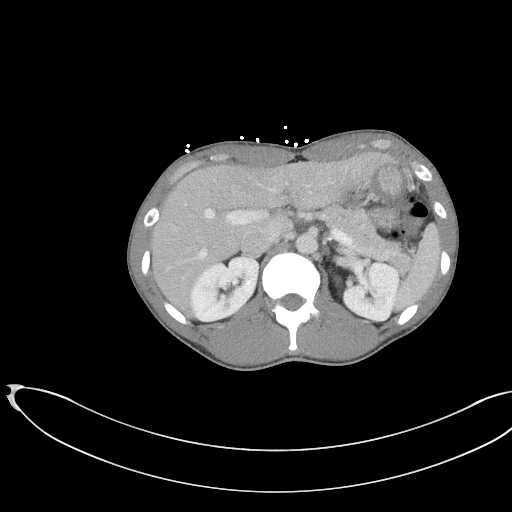
[im 79/94  soft-tissue]
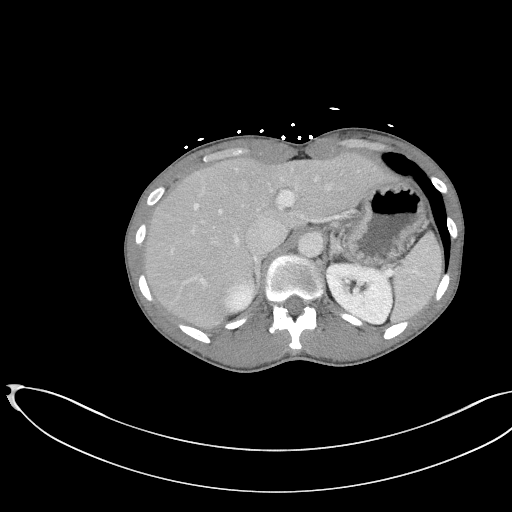
[im 89/94  soft-tissue]
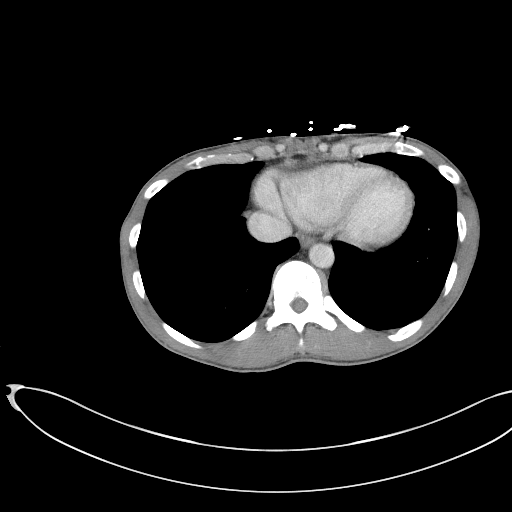

[Series 5: coronal st · coronal · 0.68mm/px · 3 of 80 slices shown]
[im 27/80  soft-tissue]
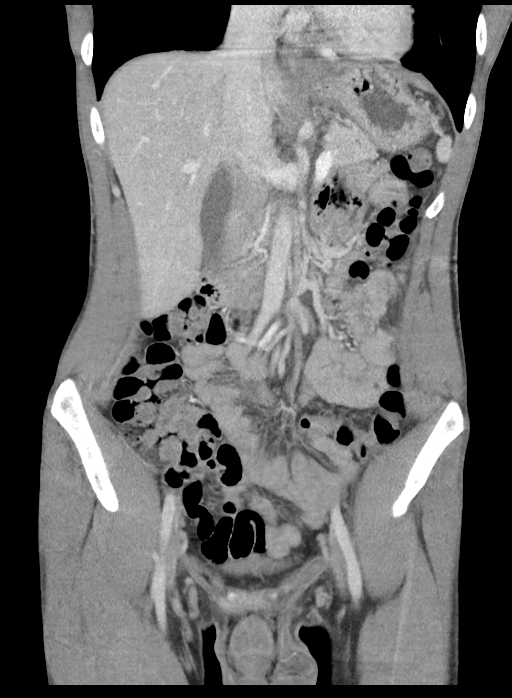
[im 36/80  soft-tissue]
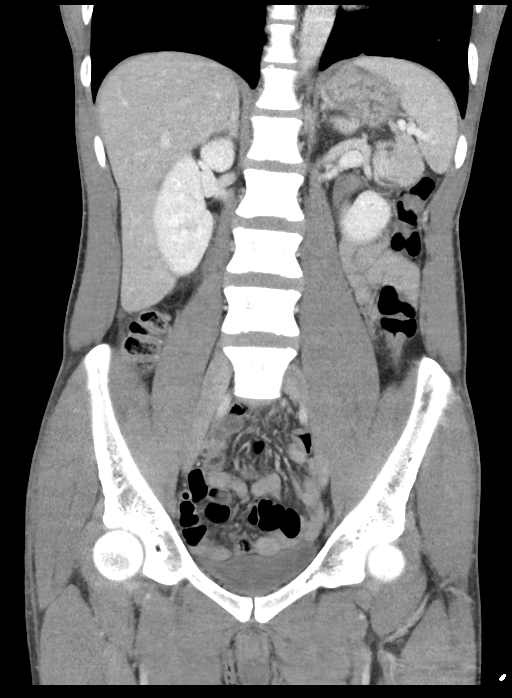
[im 44/80  soft-tissue]
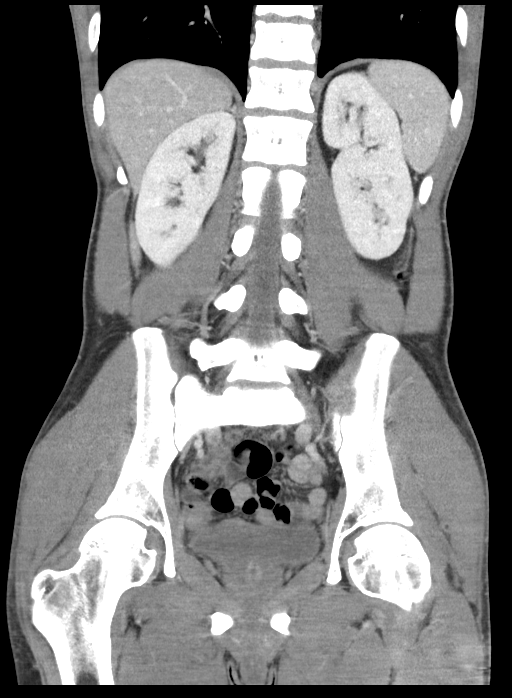

[16 of 46 positions shown; findings below may reference images not displayed]

FINDINGS: Lower chest: Lung bases clear bilaterally.

Hepatobiliary: No focal liver abnormality is seen. No gallstones,
gallbladder wall thickening, or biliary dilatation.

Pancreas: Negative

Spleen: Negative

Adrenals/Urinary Tract: Adrenal glands are unremarkable. Kidneys are
normal, without renal calculi, focal lesion, or hydronephrosis.
Bladder is unremarkable.

Stomach/Bowel: Stomach is within normal limits. Appendix appears
normal. No evidence of bowel wall thickening, distention, or
inflammatory changes.

Vascular/Lymphatic: Negative

Reproductive: Negative

Other: None

Musculoskeletal: Negative
IMPRESSION: Negative CT abdomen pelvis. No cause for the patient's symptoms is
identified.
